# Patient Record
Sex: Male | Born: 1955 | Race: White | Hispanic: No | Marital: Married | Smoking: Former smoker
Health system: Southern US, Community
[De-identification: ages and names within clinical notes are randomized; demographics above are authoritative.]

## PROBLEM LIST (undated history)

## (undated) DIAGNOSIS — M1990 Unspecified osteoarthritis, unspecified site: Secondary | ICD-10-CM

## (undated) DIAGNOSIS — F419 Anxiety disorder, unspecified: Secondary | ICD-10-CM

## (undated) DIAGNOSIS — F329 Major depressive disorder, single episode, unspecified: Secondary | ICD-10-CM

## (undated) DIAGNOSIS — T884XXA Failed or difficult intubation, initial encounter: Secondary | ICD-10-CM

## (undated) DIAGNOSIS — I10 Essential (primary) hypertension: Secondary | ICD-10-CM

## (undated) DIAGNOSIS — J449 Chronic obstructive pulmonary disease, unspecified: Secondary | ICD-10-CM

## (undated) DIAGNOSIS — G8929 Other chronic pain: Secondary | ICD-10-CM

## (undated) DIAGNOSIS — I7300 Raynaud's syndrome without gangrene: Secondary | ICD-10-CM

## (undated) DIAGNOSIS — F32A Depression, unspecified: Secondary | ICD-10-CM

## (undated) HISTORY — PX: APPENDECTOMY: SHX54

## (undated) HISTORY — DX: Chronic obstructive pulmonary disease, unspecified: J44.9

---

## 2006-06-19 ENCOUNTER — Emergency Department: Payer: Self-pay | Admitting: Emergency Medicine

## 2008-12-15 ENCOUNTER — Emergency Department: Payer: Self-pay | Admitting: Emergency Medicine

## 2008-12-16 ENCOUNTER — Emergency Department: Payer: Self-pay | Admitting: Unknown Physician Specialty

## 2008-12-17 ENCOUNTER — Emergency Department: Payer: Self-pay | Admitting: Internal Medicine

## 2010-01-07 ENCOUNTER — Emergency Department: Payer: Self-pay | Admitting: Emergency Medicine

## 2010-02-15 ENCOUNTER — Emergency Department: Payer: Self-pay | Admitting: Emergency Medicine

## 2010-07-30 ENCOUNTER — Emergency Department: Payer: Self-pay | Admitting: Emergency Medicine

## 2010-08-04 ENCOUNTER — Emergency Department: Payer: Self-pay | Admitting: Emergency Medicine

## 2010-08-27 ENCOUNTER — Ambulatory Visit: Payer: Self-pay | Admitting: Neurosurgery

## 2012-03-16 ENCOUNTER — Emergency Department: Payer: Self-pay | Admitting: Emergency Medicine
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
## 2013-12-08 ENCOUNTER — Emergency Department: Payer: Self-pay | Admitting: Emergency Medicine

## 2014-11-20 DIAGNOSIS — G8929 Other chronic pain: Secondary | ICD-10-CM | POA: Insufficient documentation

## 2014-11-20 DIAGNOSIS — Z720 Tobacco use: Secondary | ICD-10-CM | POA: Insufficient documentation

## 2014-11-20 DIAGNOSIS — M25562 Pain in left knee: Secondary | ICD-10-CM | POA: Insufficient documentation

## 2014-11-20 DIAGNOSIS — M545 Low back pain, unspecified: Secondary | ICD-10-CM | POA: Insufficient documentation

## 2014-12-31 ENCOUNTER — Encounter: Payer: Self-pay | Admitting: Emergency Medicine

## 2014-12-31 ENCOUNTER — Emergency Department
Admission: EM | Admit: 2014-12-31 | Discharge: 2014-12-31 | Disposition: A | Discharge status: Home / Self Care | Payer: Self-pay | Attending: Emergency Medicine | Admitting: Emergency Medicine

## 2014-12-31 DIAGNOSIS — K047 Periapical abscess without sinus: Secondary | ICD-10-CM | POA: Insufficient documentation

## 2014-12-31 DIAGNOSIS — I10 Essential (primary) hypertension: Secondary | ICD-10-CM | POA: Insufficient documentation

## 2014-12-31 DIAGNOSIS — Z720 Tobacco use: Secondary | ICD-10-CM | POA: Insufficient documentation

## 2014-12-31 HISTORY — DX: Essential (primary) hypertension: I10

## 2014-12-31 MED ORDER — IBUPROFEN 800 MG PO TABS: 800.0000 mg | ORAL_TABLET | Freq: Three times a day (TID) | ORAL | Status: DC | PRN

## 2014-12-31 MED ORDER — PENICILLIN V POTASSIUM 500 MG PO TABS: 500.0000 mg | ORAL_TABLET | Freq: Four times a day (QID) | ORAL | Status: DC

## 2014-12-31 MED ORDER — CLINDAMYCIN PHOSPHATE 900 MG/6ML IJ SOLN
600.0000 mg | Freq: Once | INTRAMUSCULAR | Status: AC
Start: 2014-12-31 — End: 2014-12-31
  Administered 2014-12-31: 600 mg via INTRAMUSCULAR
  Filled 2014-12-31: qty 6

## 2014-12-31 NOTE — ED Notes (Signed)
Swelling to left tooth.

## 2014-12-31 NOTE — Discharge Instructions (Signed)
Dental Abscess °A dental abscess is pus in or around a tooth. °HOME CARE °· Take medicines only as told by your dentist. °· If you were prescribed antibiotic medicine, finish all of it even if you start to feel better. °· Rinse your mouth (gargle) often with salt water. °· Do not drive or use heavy machinery, like a lawn mower, while taking pain medicine. °· Do not apply heat to the outside of your mouth. °· Keep all follow-up visits as told by your dentist. This is important. °GET HELP IF: °· Your pain is worse, and medicine does not help. °GET HELP RIGHT AWAY IF: °· You have a fever or chills. °· Your symptoms suddenly get worse. °· You have a very bad headache. °· You have problems breathing or swallowing. °· You have trouble opening your mouth. °· You have puffiness (swelling) in your neck or around your eye. °  °This information is not intended to replace advice given to you by your health care provider. Make sure you discuss any questions you have with your health care provider. °  °Document Released: 07/22/2014 Document Reviewed: 07/22/2014 °Elsevier Interactive Patient Education ©2016 Elsevier Inc[employer] ° ° °OPTIONS FOR DENTAL FOLLOW UP CARE ° °[place] Department of Health and Human Services - Local Safety Net Dental Clinics °http://www.ncdhhs.gov/dph/oralhealth/services/safetynetclinics.htm °  °[facility] (336-562-3123) ° °[facility] [place] (919-933-9087) ° °[facility] (919-663-1744 ext 237) ° °[facility] (336-570-6415) ° °[facility] (919-968-2025) °This clinic caters to the indigent population and is on a lottery system. °Location: °[facility], [address], [place] °Clinic Hours: °Wednesdays from 6pm - 9pm, patients seen by a lottery system. °For dates, call or go to www.med.[facility].edu/[facility]/patients/Dental-[facility] °Services: °Cleanings, fillings and simple extractions. °Payment Options: °DENTAL WORK IS FREE OF CHARGE. Bring proof  of income or support. °Best way to get seen: °Arrive at 5:15 pm - this is a lottery, NOT first come/first serve, so arriving earlier will not increase your chances of being seen. °  °  °[facility] °919-537-3737 °Select option 1 for emergencies °  °Location: °[facility], [address], [place] °Clinic Hours: °No walk-ins accepted - call the day before to schedule an appointment. °Check in times are 9:30 am and 1:30 pm. °Services: °Simple extractions, temporary fillings, pulpectomy/pulp debridement, uncomplicated abscess drainage. °Payment Options: °PAYMENT IS DUE AT THE TIME OF SERVICE.  Fee is usually $100-200, additional surgical procedures (e.g. abscess drainage) may be extra. °Cash, checks, Visa/MasterCard accepted.  Can file Medicaid if patient is covered for dental - patient should call case worker to check. °No discount for [facility] patients. °Best way to get seen: °MUST call the day before and get onto the schedule. Can usually be seen the next 1-2 days. No walk-ins accepted. °  °  °[facility] °919-933-9087 °  °Location: °[facility], [address], [place] °Clinic Hours: °M, W, Th, F 8am or 1:30pm, Tues 9a or 1:30 - first come/first served. °Services: °Simple extractions, temporary fillings, uncomplicated abscess drainage.  You do not need to be an [place] resident. °Payment Options: °PAYMENT IS DUE AT THE TIME OF SERVICE. °Dental insurance, otherwise sliding scale - bring proof of income or support. °Depending on income and treatment needed, cost is usually $50-200. °Best way to get seen: °Arrive early as it is first come/first served. °  °  °[facility] °919-542-1641 °  °  Location: °[address] °Clinic Hours: °Mon-Thu 8a-5p °Services: °Most basic dental services including extractions and fillings. °Payment Options: °PAYMENT IS DUE AT THE TIME OF  SERVICE. °Sliding scale, up to 50% off - bring proof if income or support. °Medicaid with dental option accepted. °Best way to get seen: °Call to schedule an appointment, can usually be seen within 2 weeks OR they will try to see walk-ins - show up at 8a or 2p (you may have to wait). °  °  °[facility] °919-245-2435 °[place] RESIDENTS ONLY °  °Location: °[facility], [address], [place], [place] [address] °Clinic Hours: By appointment only. °Monday - Thursday 8am-5pm, Friday 8am-12pm °Services: Cleanings, fillings, extractions. °Payment Options: °PAYMENT IS DUE AT THE TIME OF SERVICE. °Cash, Visa or MasterCard. Sliding scale - $30 minimum per service. °Best way to get seen: °Come in to office, complete packet and make an appointment - need proof of income °or support monies for each household member and proof of [place] residence. °Usually takes about a month to get in. °  °  °[facility] °[phone] °  °Location: °[address] [place] °Clinic Hours: Walk-in Urgent Care Dental Services are offered Monday-Friday mornings only. °The numbers of emergencies accepted daily is limited to the number of °providers available. °Maximum 15 - Mondays, Wednesdays & Thursdays °Maximum 10 - Tuesdays & Fridays °Services: °You do not need to be a [place] resident to be seen for a dental emergency. °Emergencies are defined as pain, swelling, abnormal bleeding, or dental trauma. Walkins will receive x-rays if needed. °NOTE: Dental cleaning is not an emergency. °Payment Options: °PAYMENT IS DUE AT THE TIME OF SERVICE. °Minimum co-pay is $40.00 for uninsured patients. °Minimum co-pay is $3.00 for Medicaid with dental coverage. °Dental Insurance is accepted and must be presented at time of visit. °Medicare does not cover dental. °Forms of payment: Cash, credit card, checks. °Best way to get seen: °If not previously registered with the clinic,  walk-in dental registration begins at 7:15 am and is on a first come/first serve basis. °If previously registered with the clinic, call to make an appointment. °  °  °[facility] °919-776-4359 °[place] RESIDENTS ONLY °  °Location: °[address], [place],  °Clinic Hours: °Mon-Thu 10a-2p °Services: Extractions only! °Payment Options: °FREE (donations accepted) - bring proof of income or support °Best way to get seen: °Call and schedule an appointment OR come at 8am on the 1st Monday of every month (except for holidays) when it is first come/first served. °  °  °[facility] °[phone] °  °Location: °[address], [place] °Clinic Hours: °Friday mornings °Services, Payment Options, Best way to get seen: °Call for info °

## 2014-12-31 NOTE — ED Provider Notes (Signed)
[facility] Emergency Department Provider Note  ____________________________________________  Time seen: Approximately 9:08 AM  I have reviewed the triage vital signs and the nursing notes.   HISTORY  Chief Complaint Dental Pain   HPI Carlisle Enke is a 59 y.o. male is here with complaint of left lower tooth pain and abscess. Patient states that he "lost the filling in his tooth". He states he has called several dentists to not see him while he has infection in his tooth. Patient has applied some over-the-counter paste to his tooth. He states that last evening his face began to swell. He denies any known fever or chills. He has not taken any over-the-counter medication for this. Patient does continue to smoke 1 pack cigarettes per day.  Past Medical History  Diagnosis Date  . Hypertension     There are no active problems to display for this patient.   No past surgical history on file.  Current Outpatient Rx  Name  Route  Sig  Dispense  Refill  . ibuprofen (ADVIL,MOTRIN) 800 MG tablet   Oral   Take 1 tablet (800 mg total) by mouth every 8 (eight) hours as needed.   30 tablet   0   . penicillin v potassium (VEETID) 500 MG tablet   Oral   Take 1 tablet (500 mg total) by mouth 4 (four) times daily.   28 tablet   0     Allergies Review of patient's allergies indicates no known allergies.  No family history on file.  Social History Social History  Substance Use Topics  . Smoking status: Current Every Day Smoker  . Smokeless tobacco: None  . Alcohol Use: Yes    Review of Systems Constitutional: No fever/chills ENT: No sore throat. Dental pain positive Cardiovascular: Denies chest pain. Respiratory: Denies shortness of breath. Gastrointestinal:  No nausea, no vomiting. Musculoskeletal: Negative for back pain. Skin: Negative for rash. Neurological: Negative for headaches, focal weakness or numbness.   10-point ROS otherwise  negative.  ____________________________________________   PHYSICAL EXAM:  VITAL SIGNS: ED Triage Vitals  Enc Vitals Group     BP 12/31/14 0836 154/73 mmHg     Pulse Rate 12/31/14 0836 108     Resp 12/31/14 0836 18     Temp 12/31/14 0836 97.4 F (36.3 C)     Temp Source 12/31/14 0836 Oral     SpO2 12/31/14 0836 97 %     Weight 12/31/14 0836 140 lb (63.504 kg)     Height 12/31/14 0836  (1.702 m)     Head Cir --      Peak Flow --      Pain Score 12/31/14 0832 10     Pain Loc --      Pain Edu? --      Excl. in GC? --     Constitutional: Alert and oriented. Well appearing and in no acute distress. Eyes: Conjunctivae are normal. PERRL. EOMI. Head: Atraumatic. Nose: No congestion/rhinnorhea. Mouth/Throat: Mucous membranes are moist.  Oropharynx non-erythematous. Left lower molar posteriorly with white substance packed into the tooth. There is moderate swelling of the gum in that area. No active drainage is noted. Mouth generally is in poor dentition. There is moderate edema on the left lower mandible exteriorly that coincides with the dental abscess. Neck: No stridor. Supple  Hematological/Lymphatic/Immunilogical: No cervical lymphadenopathy. Cardiovascular: Normal rate, regular rhythm. Grossly normal heart sounds.  Good peripheral circulation. Respiratory: Normal respiratory effort.  No retractions. Lungs CTAB. Gastrointestinal:  Soft and nontender. No distention.  Musculoskeletal: No lower extremity tenderness nor edema.  No joint effusions. Neurologic:  Normal speech and language. No gross focal neurologic deficits are appreciated. No gait instability. Skin:  Skin is warm, dry and intact. No rash noted. Psychiatric: Mood and affect are normal. Speech and behavior are normal.  ____________________________________________   LABS (all labs ordered are listed, but only abnormal results are displayed)  Labs Reviewed - No data to display PROCEDURES  Procedure(s) performed:  None  Critical Care performed: No  ____________________________________________   INITIAL IMPRESSION / ASSESSMENT AND PLAN / ED COURSE  Pertinent labs & imaging results that were available during my care of the patient were reviewed by me and considered in my medical decision making (see chart for details).  She was given clindamycin while in the emergency room. He was also given a prescription for Pen-Vee K to be taken 4 times a day. He is given a prescription for ibuprofen 800 mg 3 times a day. He is to call for a dental appointment. ____________________________________________   FINAL CLINICAL IMPRESSION(S) / ED DIAGNOSES  Final diagnoses:  Dental abscess      Tommi RumpsRhonda L Summers, PA-C 12/31/14 1224  Jeanmarie PlantJames A McShane, MD 12/31/14 1529

## 2016-02-15 ENCOUNTER — Observation Stay
Admission: EM | Admit: 2016-02-15 | Discharge: 2016-02-15 | Discharge status: Against Medical Advice | Payer: Self-pay | Attending: Internal Medicine | Admitting: Internal Medicine

## 2016-02-15 ENCOUNTER — Encounter: Payer: Self-pay | Admitting: Emergency Medicine

## 2016-02-15 ENCOUNTER — Emergency Department: Payer: Self-pay

## 2016-02-15 DIAGNOSIS — F329 Major depressive disorder, single episode, unspecified: Secondary | ICD-10-CM | POA: Insufficient documentation

## 2016-02-15 DIAGNOSIS — R079 Chest pain, unspecified: Principal | ICD-10-CM

## 2016-02-15 DIAGNOSIS — Z79899 Other long term (current) drug therapy: Secondary | ICD-10-CM | POA: Insufficient documentation

## 2016-02-15 DIAGNOSIS — Z8249 Family history of ischemic heart disease and other diseases of the circulatory system: Secondary | ICD-10-CM | POA: Insufficient documentation

## 2016-02-15 DIAGNOSIS — F17200 Nicotine dependence, unspecified, uncomplicated: Secondary | ICD-10-CM | POA: Insufficient documentation

## 2016-02-15 DIAGNOSIS — R0602 Shortness of breath: Secondary | ICD-10-CM

## 2016-02-15 DIAGNOSIS — I7300 Raynaud's syndrome without gangrene: Secondary | ICD-10-CM | POA: Insufficient documentation

## 2016-02-15 DIAGNOSIS — Z8379 Family history of other diseases of the digestive system: Secondary | ICD-10-CM | POA: Insufficient documentation

## 2016-02-15 DIAGNOSIS — I10 Essential (primary) hypertension: Secondary | ICD-10-CM | POA: Insufficient documentation

## 2016-02-15 DIAGNOSIS — I700 Atherosclerosis of aorta: Secondary | ICD-10-CM | POA: Insufficient documentation

## 2016-02-15 DIAGNOSIS — R61 Generalized hyperhidrosis: Secondary | ICD-10-CM

## 2016-02-15 DIAGNOSIS — F419 Anxiety disorder, unspecified: Secondary | ICD-10-CM | POA: Insufficient documentation

## 2016-02-15 DIAGNOSIS — G8929 Other chronic pain: Secondary | ICD-10-CM | POA: Insufficient documentation

## 2016-02-15 HISTORY — DX: Unspecified osteoarthritis, unspecified site: M19.90

## 2016-02-15 HISTORY — DX: Depression, unspecified: F32.A

## 2016-02-15 HISTORY — DX: Major depressive disorder, single episode, unspecified: F32.9

## 2016-02-15 HISTORY — DX: Other chronic pain: G89.29

## 2016-02-15 HISTORY — DX: Anxiety disorder, unspecified: F41.9

## 2016-02-15 HISTORY — DX: Raynaud's syndrome without gangrene: I73.00

## 2016-02-15 LAB — BASIC METABOLIC PANEL
Anion gap: 7 (ref 5–15)
BUN: 16 mg/dL (ref 6–20)
CO2: 28 mmol/L (ref 22–32)
Calcium: 8.9 mg/dL (ref 8.9–10.3)
Chloride: 104 mmol/L (ref 101–111)
Creatinine, Ser: 0.64 mg/dL (ref 0.61–1.24)
GFR calc Af Amer: >60 mL/min (ref >60)
GFR calc non Af Amer: >60 mL/min (ref >60)
Glucose, Bld: 103 mg/dL — ABNORMAL HIGH (ref 65–99)
Potassium: 3.4 mmol/L — ABNORMAL LOW (ref 3.5–5.1)
Sodium: 139 mmol/L (ref 135–145)

## 2016-02-15 LAB — CBC
HCT: 43.6 % (ref 40.0–52.0)
Hemoglobin: 14.6 g/dL (ref 13.0–18.0)
MCH: 29.2 pg (ref 26.0–34.0)
MCHC: 33.6 g/dL (ref 32.0–36.0)
MCV: 87.1 fL (ref 80.0–100.0)
Platelets: 248 10*3/uL (ref 150–440)
RBC: 5.01 MIL/uL (ref 4.40–5.90)
RDW: 13.8 % (ref 11.5–14.5)
WBC: 12.7 10*3/uL — ABNORMAL HIGH (ref 3.8–10.6)

## 2016-02-15 LAB — TROPONIN I: Troponin I: <0.03 ng/mL (ref <0.03)

## 2016-02-15 MED ORDER — METOPROLOL TARTRATE 25 MG PO TABS: 25.0000 mg | ORAL_TABLET | Freq: Two times a day (BID) | ORAL | Status: DC

## 2016-02-15 MED ORDER — OXYCODONE HCL 5 MG PO TABS: 10.0000 mg | ORAL_TABLET | ORAL | Status: DC | PRN

## 2016-02-15 MED ORDER — NICOTINE 21 MG/24HR TD PT24
21.0000 mg | MEDICATED_PATCH | Freq: Every day | TRANSDERMAL | Status: DC
  Administered 2016-02-15: 21 mg via TRANSDERMAL
  Filled 2016-02-15: qty 1

## 2016-02-15 MED ORDER — OXYCODONE HCL ER 10 MG PO T12A: 10.0000 mg | EXTENDED_RELEASE_TABLET | ORAL | Status: DC | PRN

## 2016-02-15 MED ORDER — ACETAMINOPHEN 325 MG PO TABS: 650.0000 mg | ORAL_TABLET | Freq: Four times a day (QID) | ORAL | Status: DC | PRN

## 2016-02-15 MED ORDER — SODIUM CHLORIDE 0.9 % IV BOLUS (SEPSIS)
1000.0000 mL | Freq: Once | INTRAVENOUS | Status: AC
  Administered 2016-02-15: 1000 mL via INTRAVENOUS

## 2016-02-15 MED ORDER — ASPIRIN EC 81 MG PO TBEC: 81.0000 mg | DELAYED_RELEASE_TABLET | Freq: Every day | ORAL | Status: DC

## 2016-02-15 MED ORDER — ACETAMINOPHEN 650 MG RE SUPP: 650.0000 mg | Freq: Four times a day (QID) | RECTAL | Status: DC | PRN

## 2016-02-15 MED ORDER — FAMOTIDINE 20 MG PO TABS
20.0000 mg | ORAL_TABLET | Freq: Two times a day (BID) | ORAL | Status: DC
  Administered 2016-02-15: 20 mg via ORAL
  Filled 2016-02-15: qty 1

## 2016-02-15 MED ORDER — ENOXAPARIN SODIUM 40 MG/0.4ML ~~LOC~~ SOLN: 40.0000 mg | SUBCUTANEOUS | Status: DC

## 2016-02-15 MED ORDER — POTASSIUM CHLORIDE CRYS ER 20 MEQ PO TBCR: 40.0000 meq | EXTENDED_RELEASE_TABLET | Freq: Once | ORAL | Status: DC

## 2016-02-15 NOTE — ED Notes (Signed)
Pt did take a viagra last night

## 2016-02-15 NOTE — H&P (Addendum)
[facility]   PATIENT NAME: Leonard Rogers    MR#:  [id number]  DATE OF BIRTH:  1956/01/15  DATE OF ADMISSION:  02/15/2016  PRIMARY CARE PHYSICIAN: Dr. Excell [place]eltzerMosley in [place]  REQUESTING/REFERRING PHYSICIAN: Dr. Virgilio Freesaroline Norman  CHIEF COMPLAINT:   Chief Complaint  Patient presents with  . Chest Pain    HISTORY OF PRESENT ILLNESS:  Leonard Rogers  is a 60 y.o. male with a known history of Hypertension presents with chest pain. He took a Viagra at midnight. He ate a late supper and pump and pride. He developed worse chest pressure in his life around 6:30 this a.m. 10 out of 10 intensity. He never felt that before. Hard to describe. Walking would make it go away. Associated with sweating, clamminess, nauseousness and shortness of breath. EMS gave 4 aspirin and he started feeling a little bit better. Now he does not have any chest pain. In the ER his first troponin is negative. Hospitalist services contacted for further evaluation. Patient states his blood pressure was low with EMS.  PAST MEDICAL HISTORY:   Past Medical History:  Diagnosis Date  . Anxiety   . Arthritis   . Chronic pain   . Depression   . Hypertension   . Raynaud disease     PAST SURGICAL HISTORY:   Past Surgical History:  Procedure Laterality Date  . APPENDECTOMY      SOCIAL HISTORY:   Social History  Substance Use Topics  . Smoking status: Current Every Day Smoker    Packs/day: 1.00  . Smokeless tobacco: Never Used  . Alcohol use 4.2 oz/week    7 Cans of beer per week    FAMILY HISTORY:   Family History  Problem Relation Age of Onset  . CAD Mother   . Cirrhosis Father   . CAD Brother     DRUG ALLERGIES:   Allergies  Allergen Reactions  . Vistaril [Hydroxyzine Hcl]     Unknown per pt    REVIEW OF SYSTEMS:  CONSTITUTIONAL: No fever, Positive for fatigue. Positive for clamminess and sweatiness. EYES: Needs glasses EARS, NOSE, AND  THROAT: No tinnitus or ear pain. No sore throat. Positive runny nose RESPIRATORY: No cough, positive for shortness of breath, no wheezing or hemoptysis.  CARDIOVASCULAR: Positive for chest pain. No orthopnea, edema.  GASTROINTESTINAL: Positive for nausea. No vomiting, diarrhea or abdominal pain. No blood in bowel movements GENITOURINARY: No dysuria, hematuria.  ENDOCRINE: No polyuria, nocturia,  HEMATOLOGY: No anemia, easy bruising or bleeding SKIN: No rash or lesion. MUSCULOSKELETAL: No joint pain or arthritis.   NEUROLOGIC: No tingling, numbness, weakness.  PSYCHIATRY: Positive for anxiety and depression.   MEDICATIONS AT HOME:   Prior to Admission medications   Medication Sig Start Date End Date Taking? Authorizing Provider  amLODipine (NORVASC) 10 MG tablet Take 10 mg by mouth daily.   Yes Historical Provider, MD  celecoxib (CELEBREX) 200 MG capsule Take 200 mg by mouth 2 (two) times daily.   Yes Historical Provider, MD  hydrochlorothiazide (HYDRODIURIL) 25 MG tablet Take 25 mg by mouth daily.   Yes Historical Provider, MD  metoprolol tartrate (LOPRESSOR) 25 MG tablet Take 25 mg by mouth 2 (two) times daily.   Yes Historical Provider, MD  oxyCODONE (OXYCONTIN) 10 mg 12 hr tablet Take 10 mg by mouth every 4 (four) hours as needed.   Yes Historical Provider, MD      VITAL SIGNS:  Blood pressure 111/65, pulse 95, temperature 97.5  F (36.4 C), temperature source Oral, resp. rate 10, height 5\' 8"  (1.727 m), weight 59.9 kg (132 lb), SpO2 100 %.  PHYSICAL EXAMINATION:  GENERAL:  60 y.o.-year-old patient lying in the bed with no acute distress.  EYES: Pupils equal, round, reactive to light and accommodation. No scleral icterus. Extraocular muscles intact.  HEENT: Head atraumatic, normocephalic. Oropharynx and nasopharynx clear.  NECK:  Supple, no jugular venous distention. No thyroid enlargement, no tenderness.  LUNGS: Normal breath sounds bilaterally, no wheezing, rales,rhonchi or  crepitation. No use of accessory muscles of respiration.  CARDIOVASCULAR: S1, S2 normal. No murmurs, rubs, or gallops.  ABDOMEN: Soft, nontender, nondistended. Bowel sounds present. No organomegaly or mass.  EXTREMITIES: No pedal edema, cyanosis, or clubbing.  NEUROLOGIC: Cranial nerves II through XII are intact. Muscle strength 5/5 in all extremities. Sensation intact. Gait not checked.  PSYCHIATRIC: The patient is alert and oriented x 3.  SKIN: No rash, lesion, or ulcer.   LABORATORY PANEL:   CBC  Recent Labs Lab 02/15/16 0753  WBC 12.7*  HGB 14.6  HCT 43.6  PLT 248   ------------------------------------------------------------------------------------------------------------------  Chemistries   Recent Labs Lab 02/15/16 0753  NA 139  K 3.4*  CL 104  CO2 28  GLUCOSE 103*  BUN 16  CREATININE 0.64  CALCIUM 8.9   ------------------------------------------------------------------------------------------------------------------  Cardiac Enzymes  Recent Labs Lab 02/15/16 0753  TROPONINI <0.03   ------------------------------------------------------------------------------------------------------------------  RADIOLOGY:  Dg Chest 2 View  Result Date: 02/15/2016 CLINICAL DATA:  Chest pain.  Tobacco use. EXAM: CHEST  2 VIEW COMPARISON:  None. FINDINGS: Lungs are clear. Heart size and pulmonary vascularity are normal. No adenopathy. There is atherosclerotic calcification in the aorta. No bone lesions. IMPRESSION: Aortic atherosclerosis.  No edema or consolidation. Electronically Signed   By: Bretta BangWilliam  Woodruff III M.D.   On: 02/15/2016 08:33    EKG:   Normal sinus rhythm 91 bpm no acute ST-T wave changes.  IMPRESSION AND PLAN:   1. Chest pain. Admit as observation overnight get 2 more sets of cardiac enzymes. If those heart enzymes are negative will get a stress test in the morning. Continue aspirin. Since he ate late last night this could also be acid reflux. Start  Pepcid. Since he took a Viagra no nitrates can be given. 2. Chronic pain continue oxycodone 3. Essential hypertension with his blood pressure on the lower side. Continue metoprolol but hold her thiazide and Norvasc. 4. History of Raynaud's phenomenon 5. Tobacco abuse. Smoking cessation counseling done 3 minutes by me. Nicotine patch ordered  All the records are reviewed and case discussed with ED provider. Management plans discussed with the patient, family and they are in agreement.  CODE STATUS: Full code  TOTAL TIME TAKING CARE OF THIS PATIENT: 50 minutes.    Alford HighlandWIETING, Tyrese Ficek M.D on 02/15/2016 at 9:31 AM  Between 7am to 6pm - Pager - [phone]  After 6pm call admission pager [phone]  [facility]  [phone]  CC: Primary care physician; Dr. Excell SeltzerMosley in [place]

## 2016-02-15 NOTE — Progress Notes (Signed)
Pt reports no chest pain. A & O. Takes meds ok. Up to BR and tolerated it well. Up to BR and tolerated it. Pt walking towards elevator Jaimie spotted patient. Pt states "I'm leaving". Pt pulled IV out and tele removed. Pt signed AMA form and was witnessed by Electrical engineerrachel RN. MD wieting was notifiied.

## 2016-02-15 NOTE — ED Notes (Signed)
Patient transported to X-ray 

## 2016-02-15 NOTE — ED Provider Notes (Signed)
[facility] Emergency Department Provider Note  ____________________________________________  Time seen: Approximately 8:34 AM  I have reviewed the triage vital signs and the nursing notes.   HISTORY  Chief Complaint Chest Pain    HPI Leonard Rogers is a 60 y.o. 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 The patient reports that over the past week he has had 3 episodes of intermittent mild chest pain that can occur with exertion or at rest, are centrally located, and had no associated symptoms. They have resolved on their own. Last night, the patient took Viagra, had sexual intercourse, and around 6:30 AM was eating a piece of pie when he developed severepressure and tightness" on both sides of the breastbone." This was associated with shortness of breath, diaphoresis. EMS reports that the patient was pale and clammy on arrival. The patient reports that the pain subsided after chewing 4 baby aspirin. At this time, the patient is completely symptom-free. He has never had a stress test or cardiac catheterization.  FH: brother died MI age 58  SH: + tobacco, denies cocaine   Past Medical History:  Diagnosis Date  . Hypertension     There are no active problems to display for this patient.   History reviewed. No pertinent surgical history.  Current Outpatient Rx  . Order #: [id number] Class: Print  . Order #: [id number] Class: Print    Allergies Vistaril [hydroxyzine hcl]  History reviewed. No pertinent family history.  Social History Social History  Substance Use Topics  . Smoking status: Current Every Day Smoker  . Smokeless tobacco: Not on file  . Alcohol use Yes    Review of Systems Constitutional: No fever/chills.No lightheadedness or syncope. Positive diaphoresis Eyes: No visual changes. ENT: No sore throat. No congestion or rhinorrhea. Cardiovascular: Positive chest pain.  Denies palpitations. Respiratory: Positive shortness of breath.  No cough. Gastrointestinal: No abdominal pain.  Positive nausea, no vomiting.  No diarrhea.  No constipation. Genitourinary: Negative for dysuria. Musculoskeletal: Negative for back pain. Skin: Negative for rash. Neurological: Negative for headaches. No focal numbness, tingling or weakness.   10-point ROS otherwise negative.  ____________________________________________   PHYSICAL EXAM:  VITAL SIGNS: ED Triage Vitals  Enc Vitals Group     BP 02/15/16 0758 106/76     Pulse Rate 02/15/16 0758 93     Resp 02/15/16 0758 (!) 24     Temp 02/15/16 0758 97.5 F (36.4 C)     Temp Source 02/15/16 0758 Oral     SpO2 02/15/16 0758 98 %     Weight 02/15/16 0755 132 lb (59.9 kg)     Height 02/15/16 0755 5\' 8"  (1.727 m)     Head Circumference --      Peak Flow --      Pain Score --      Pain Loc --      Pain Edu? --      Excl. in GC? --     Constitutional: Alert and oriented. Well appearing and in no acute distress. Answers questions appropriately. Eyes: Conjunctivae are normal.  EOMI. No scleral icterus. Head: Atraumatic. Nose: No congestion/rhinnorhea. Mouth/Throat: Mucous membranes are moist.  Neck: No stridor.  Supple.  No JVD. Cardiovascular: Normal rate, regular rhythm. No murmurs, rubs or gallops.  Respiratory: Normal respiratory effort.  No accessory muscle use or retractions. Lungs CTAB.  No wheezes, rales or ronchi. Gastrointestinal: Soft, nontender and nondistended.  No guarding or rebound.  No peritoneal signs. Musculoskeletal: No LE edema. No ttp in the calves or palpable cords.  Negative Homan's sign. Neurologic:  A&Ox3.  Speech is clear.  Face and smile are symmetric.  EOMI.  Moves all extremities well. Skin:  Skin is warm, dry and intact. No rash noted. Psychiatric: Mood and affect are normal. Speech and behavior are normal.  Normal judgement.  ____________________________________________   LABS (all  labs ordered are listed, but only abnormal results are displayed)  Labs Reviewed  BASIC METABOLIC PANEL - Abnormal; Notable for the following:       Result Value   Potassium 3.4 (*)    Glucose, Bld 103 (*)    All other components within normal limits  CBC - Abnormal; Notable for the following:    WBC 12.7 (*)    All other components within normal limits  TROPONIN I   ____________________________________________  EKG  ED ECG REPORT I, Rockne MenghiniNorman, Anne-Caroline, the attending physician, personally viewed and interpreted this ECG.   Date: 02/15/2016  EKG Time: 756  Rate: 91  Rhythm: normal sinus rhythm  Axis: normal  Intervals:none  ST&T Change: No ST elevation  ____________________________________________  RADIOLOGY  Dg Chest 2 View  Result Date: 02/15/2016 CLINICAL DATA:  Chest pain.  Tobacco use. EXAM: CHEST  2 VIEW COMPARISON:  None. FINDINGS: Lungs are clear. Heart size and pulmonary vascularity are normal. No adenopathy. There is atherosclerotic calcification in the aorta. No bone lesions. IMPRESSION: Aortic atherosclerosis.  No edema or consolidation. Electronically Signed   By: Bretta BangWilliam  Woodruff III M.D.   On: 02/15/2016 08:33    ____________________________________________   PROCEDURES  Procedure(s) performed: None  Procedures  Critical Care performed: No ____________________________________________   INITIAL IMPRESSION / ASSESSMENT AND PLAN / ED COURSE  Pertinent labs & imaging results that were available during my care of the patient were reviewed by me and considered in my medical decision making (see chart for details).  60 y.o. male with a history of HTN, ongoing tobacco abuse presenting with an episode of chest pain that was severe after 1 week of multiple more mild episodes, in the setting of having taking Viagra. Overall, the patient is mildly hypotensive with a blood pressure of 97/56 on my examination. He is no longer having any chest pain, and  therefore heparinization is not indicated at this time. Nitroglycerin is also contraindicated given his recent Viagra use and hypotension. I am concerned about ACS or MI in this patient. His EKG does not show ischemic changes, but his recent chest pain episodes with severe chest pain this morning as well as his multiple risk factors is concerning. Other etiologies including esophageal spasm or GERD in the setting of eating pie, is also possible, but I have recommended that the patient remain at the hospital for full cardiac evaluation.  ____________________________________________  FINAL CLINICAL IMPRESSION(S) / ED DIAGNOSES  Final diagnoses:  Chest pain, unspecified type  Shortness of breath  Diaphoresis    Clinical Course       NEW MEDICATIONS STARTED DURING THIS VISIT:  New Prescriptions   No medications on file      Rockne MenghiniAnne-Caroline Karah Caruthers, MD 02/15/16 [phone]

## 2016-02-15 NOTE — ED Triage Notes (Signed)
Has had some intermitten chest pain on and off for couple days but 20 min prior to calling EMS severe substernal chest pain started. Per EMS when fire arrived on scene pt was pale and clammy. Pt now warm and dry. Pt did have diaphoresis per his report. Pt was eating a piece of pie when pain started. Brother died of heart attack at 5656. Large cardiac family hx.

## 2016-02-16 ENCOUNTER — Other Ambulatory Visit: Payer: Self-pay

## 2017-03-17 ENCOUNTER — Emergency Department: Payer: Self-pay

## 2017-03-17 ENCOUNTER — Emergency Department: Payer: Self-pay | Admitting: Anesthesiology

## 2017-03-17 ENCOUNTER — Other Ambulatory Visit: Payer: Self-pay

## 2017-03-17 ENCOUNTER — Emergency Department
Admission: EM | Admit: 2017-03-17 | Discharge: 2017-03-17 | Disposition: A | Discharge status: Home / Self Care | Payer: Self-pay | Attending: Emergency Medicine | Admitting: Emergency Medicine

## 2017-03-17 ENCOUNTER — Encounter
Admission: EM | Disposition: A | Discharge status: Home / Self Care | Payer: Self-pay | Source: Home / Self Care | Attending: Emergency Medicine

## 2017-03-17 ENCOUNTER — Encounter: Payer: Self-pay | Admitting: Emergency Medicine

## 2017-03-17 DIAGNOSIS — G8929 Other chronic pain: Secondary | ICD-10-CM | POA: Insufficient documentation

## 2017-03-17 DIAGNOSIS — F329 Major depressive disorder, single episode, unspecified: Secondary | ICD-10-CM | POA: Insufficient documentation

## 2017-03-17 DIAGNOSIS — F17210 Nicotine dependence, cigarettes, uncomplicated: Secondary | ICD-10-CM | POA: Insufficient documentation

## 2017-03-17 DIAGNOSIS — M1990 Unspecified osteoarthritis, unspecified site: Secondary | ICD-10-CM | POA: Insufficient documentation

## 2017-03-17 DIAGNOSIS — K222 Esophageal obstruction: Secondary | ICD-10-CM

## 2017-03-17 DIAGNOSIS — Z888 Allergy status to other drugs, medicaments and biological substances status: Secondary | ICD-10-CM | POA: Insufficient documentation

## 2017-03-17 DIAGNOSIS — R079 Chest pain, unspecified: Secondary | ICD-10-CM

## 2017-03-17 DIAGNOSIS — F419 Anxiety disorder, unspecified: Secondary | ICD-10-CM | POA: Insufficient documentation

## 2017-03-17 DIAGNOSIS — I10 Essential (primary) hypertension: Secondary | ICD-10-CM | POA: Insufficient documentation

## 2017-03-17 DIAGNOSIS — Z79899 Other long term (current) drug therapy: Secondary | ICD-10-CM | POA: Insufficient documentation

## 2017-03-17 DIAGNOSIS — X58XXXA Exposure to other specified factors, initial encounter: Secondary | ICD-10-CM | POA: Insufficient documentation

## 2017-03-17 DIAGNOSIS — K210 Gastro-esophageal reflux disease with esophagitis: Secondary | ICD-10-CM | POA: Insufficient documentation

## 2017-03-17 DIAGNOSIS — I7300 Raynaud's syndrome without gangrene: Secondary | ICD-10-CM | POA: Insufficient documentation

## 2017-03-17 DIAGNOSIS — T18128A Food in esophagus causing other injury, initial encounter: Secondary | ICD-10-CM

## 2017-03-17 DIAGNOSIS — K2950 Unspecified chronic gastritis without bleeding: Secondary | ICD-10-CM | POA: Insufficient documentation

## 2017-03-17 DIAGNOSIS — I739 Peripheral vascular disease, unspecified: Secondary | ICD-10-CM | POA: Insufficient documentation

## 2017-03-17 HISTORY — DX: Failed or difficult intubation, initial encounter: T88.4XXA

## 2017-03-17 HISTORY — PX: ESOPHAGOGASTRODUODENOSCOPY (EGD) WITH PROPOFOL: SHX5813

## 2017-03-17 LAB — BASIC METABOLIC PANEL
Anion gap: 11 (ref 5–15)
BUN: 18 mg/dL (ref 6–20)
CALCIUM: 9.8 mg/dL (ref 8.9–10.3)
CO2: 28 mmol/L (ref 22–32)
CREATININE: 0.78 mg/dL (ref 0.61–1.24)
Chloride: 101 mmol/L (ref 101–111)
GFR calc Af Amer: >60 mL/min (ref >60)
Glucose, Bld: 127 mg/dL — ABNORMAL HIGH (ref 65–99)
Potassium: 2.9 mmol/L — ABNORMAL LOW (ref 3.5–5.1)
SODIUM: 140 mmol/L (ref 135–145)

## 2017-03-17 LAB — CBC
HCT: 46.9 % (ref 40.0–52.0)
Hemoglobin: 15.6 g/dL (ref 13.0–18.0)
MCH: 28.5 pg (ref 26.0–34.0)
MCHC: 33.4 g/dL (ref 32.0–36.0)
MCV: 85.4 fL (ref 80.0–100.0)
PLATELETS: 413 10*3/uL (ref 150–440)
RBC: 5.49 MIL/uL (ref 4.40–5.90)
RDW: 13.6 % (ref 11.5–14.5)
WBC: 11.1 10*3/uL — AB (ref 3.8–10.6)

## 2017-03-17 LAB — TROPONIN I: Troponin I: <0.03 ng/mL (ref <0.03)

## 2017-03-17 SURGERY — ESOPHAGOGASTRODUODENOSCOPY (EGD) WITH PROPOFOL
Anesthesia: Monitor Anesthesia Care

## 2017-03-17 SURGERY — ESOPHAGOGASTRODUODENOSCOPY (EGD) WITH PROPOFOL
Anesthesia: General | Site: Esophagus | Wound class: Clean Contaminated

## 2017-03-17 MED ORDER — MIDAZOLAM HCL 2 MG/2ML IJ SOLN
INTRAMUSCULAR | Status: DC | PRN
  Administered 2017-03-17: 2 mg via INTRAVENOUS

## 2017-03-17 MED ORDER — ONDANSETRON HCL 4 MG/2ML IJ SOLN
INTRAMUSCULAR | Status: AC
  Filled 2017-03-17: qty 2

## 2017-03-17 MED ORDER — ONDANSETRON 4 MG PO TBDP
4.0000 mg | ORAL_TABLET | Freq: Once | ORAL | Status: AC
  Administered 2017-03-17: 4 mg via ORAL

## 2017-03-17 MED ORDER — SUCCINYLCHOLINE CHLORIDE 20 MG/ML IJ SOLN
INTRAMUSCULAR | Status: AC
Start: 2017-03-17 — End: ?
  Filled 2017-03-17: qty 1

## 2017-03-17 MED ORDER — GI COCKTAIL ~~LOC~~
ORAL | Status: AC
  Filled 2017-03-17: qty 30

## 2017-03-17 MED ORDER — ONDANSETRON HCL 4 MG/2ML IJ SOLN
4.0000 mg | Freq: Once | INTRAMUSCULAR | Status: AC
  Administered 2017-03-17: 4 mg via INTRAVENOUS

## 2017-03-17 MED ORDER — ONDANSETRON 4 MG PO TBDP
ORAL_TABLET | ORAL | Status: AC
  Filled 2017-03-17: qty 1

## 2017-03-17 MED ORDER — ROCURONIUM BROMIDE 50 MG/5ML IV SOLN
INTRAVENOUS | Status: AC
  Filled 2017-03-17: qty 1

## 2017-03-17 MED ORDER — ONDANSETRON HCL 4 MG/2ML IJ SOLN
INTRAMUSCULAR | Status: DC | PRN
  Administered 2017-03-17: 4 mg via INTRAVENOUS

## 2017-03-17 MED ORDER — DEXAMETHASONE SODIUM PHOSPHATE 10 MG/ML IJ SOLN
INTRAMUSCULAR | Status: AC
  Filled 2017-03-17: qty 1

## 2017-03-17 MED ORDER — FENTANYL CITRATE (PF) 100 MCG/2ML IJ SOLN: 25.0000 ug | INTRAMUSCULAR | Status: DC | PRN

## 2017-03-17 MED ORDER — GI COCKTAIL ~~LOC~~
30.0000 mL | Freq: Once | ORAL | Status: AC
  Administered 2017-03-17: 30 mL via ORAL

## 2017-03-17 MED ORDER — MORPHINE SULFATE (PF) 4 MG/ML IV SOLN
INTRAVENOUS | Status: AC
  Administered 2017-03-17: 4 mg via INTRAVENOUS
  Filled 2017-03-17: qty 1

## 2017-03-17 MED ORDER — SUCCINYLCHOLINE CHLORIDE 20 MG/ML IJ SOLN
INTRAMUSCULAR | Status: DC | PRN
  Administered 2017-03-17: 100 mg via INTRAVENOUS

## 2017-03-17 MED ORDER — MIDAZOLAM HCL 2 MG/2ML IJ SOLN
INTRAMUSCULAR | Status: AC
  Filled 2017-03-17: qty 2

## 2017-03-17 MED ORDER — MORPHINE SULFATE (PF) 4 MG/ML IV SOLN
4.0000 mg | Freq: Once | INTRAVENOUS | Status: AC
  Administered 2017-03-17: 4 mg via INTRAVENOUS

## 2017-03-17 MED ORDER — LORAZEPAM 2 MG/ML IJ SOLN
INTRAMUSCULAR | Status: AC
  Administered 2017-03-17: 0.5 mg via INTRAVENOUS
  Filled 2017-03-17: qty 1

## 2017-03-17 MED ORDER — FENTANYL CITRATE (PF) 100 MCG/2ML IJ SOLN
INTRAMUSCULAR | Status: AC
  Filled 2017-03-17: qty 2

## 2017-03-17 MED ORDER — LIDOCAINE HCL (PF) 2 % IJ SOLN
INTRAMUSCULAR | Status: AC
  Filled 2017-03-17: qty 10

## 2017-03-17 MED ORDER — DEXAMETHASONE SODIUM PHOSPHATE 10 MG/ML IJ SOLN
INTRAMUSCULAR | Status: DC | PRN
  Administered 2017-03-17: 10 mg via INTRAVENOUS

## 2017-03-17 MED ORDER — IPRATROPIUM-ALBUTEROL 0.5-2.5 (3) MG/3ML IN SOLN: 3.0000 mL | Freq: Once | RESPIRATORY_TRACT | Status: DC | PRN

## 2017-03-17 MED ORDER — SODIUM CHLORIDE 0.9 % IV SOLN: INTRAVENOUS | Status: DC

## 2017-03-17 MED ORDER — OXYCODONE HCL 5 MG/5ML PO SOLN: 5.0000 mg | Freq: Once | ORAL | Status: DC | PRN

## 2017-03-17 MED ORDER — SUGAMMADEX SODIUM 200 MG/2ML IV SOLN
INTRAVENOUS | Status: DC | PRN
  Administered 2017-03-17: 130 mg via INTRAVENOUS

## 2017-03-17 MED ORDER — ROCURONIUM BROMIDE 100 MG/10ML IV SOLN
INTRAVENOUS | Status: DC | PRN
  Administered 2017-03-17: 10 mg via INTRAVENOUS
  Administered 2017-03-17: 30 mg via INTRAVENOUS

## 2017-03-17 MED ORDER — SUGAMMADEX SODIUM 200 MG/2ML IV SOLN
INTRAVENOUS | Status: AC
  Filled 2017-03-17: qty 2

## 2017-03-17 MED ORDER — OXYCODONE HCL 5 MG PO TABS: 5.0000 mg | ORAL_TABLET | Freq: Once | ORAL | Status: DC | PRN

## 2017-03-17 MED ORDER — PROPOFOL 10 MG/ML IV BOLUS
INTRAVENOUS | Status: AC
Start: 2017-03-17 — End: ?
  Filled 2017-03-17: qty 20

## 2017-03-17 MED ORDER — FENTANYL CITRATE (PF) 100 MCG/2ML IJ SOLN
INTRAMUSCULAR | Status: DC | PRN
  Administered 2017-03-17: 100 ug via INTRAVENOUS
  Administered 2017-03-17: 150 ug via INTRAVENOUS

## 2017-03-17 MED ORDER — LACTATED RINGERS IV SOLN
INTRAVENOUS | Status: DC | PRN
  Administered 2017-03-17: 22:00:00 via INTRAVENOUS

## 2017-03-17 MED ORDER — LORAZEPAM 2 MG/ML IJ SOLN
0.5000 mg | Freq: Once | INTRAMUSCULAR | Status: AC
  Administered 2017-03-17: 0.5 mg via INTRAVENOUS

## 2017-03-17 NOTE — Discharge Instructions (Signed)

## 2017-03-17 NOTE — ED Provider Notes (Signed)
[facility] Emergency Department Provider Note       Time seen: ----------------------------------------- 7:58 PM on 03/17/2017 -----------------------------------------   I have reviewed the triage vital signs and the nursing notes.  HISTORY   Chief Complaint food bolus and Chest Pain    HPI Leonard Rogers is a 61 y.o. male with a history of anxiety, chronic pain, depression and hypertension who presents to the ED for esophageal food impaction.  Patient claims of chest pain and food stuck in his throat.  Wife states he was eating steak this afternoon got a piece stuck in his throat.  He was unable to get food to pass.  Wife states chest pain started right after he choked.  Heimlich maneuver was performed and they were not able to get food out of his throat.  Patient thinks he cannot eat anything right now because of the solid food that is stuck in his esophagus.  Past Medical History:  Diagnosis Date  . Anxiety   . Arthritis   . Chronic pain   . Depression   . Hypertension   . Raynaud disease     Patient Active Problem List   Diagnosis Date Noted  . Chest pain 02/15/2016    Past Surgical History:  Procedure Laterality Date  . APPENDECTOMY      Allergies Vistaril [hydroxyzine hcl]  Social History Social History   Tobacco Use  . Smoking status: Current Every Day Smoker    Packs/day: 1.00  . Smokeless tobacco: Never Used  Substance Use Topics  . Alcohol use: Yes    Alcohol/week: 4.2 oz    Types: 7 Cans of beer per week  . Drug use: No    Review of Systems Constitutional: Negative for fever. ENT: Positive for inability to swallow Cardiovascular: Positive for chest pain Respiratory: Negative for shortness of breath. Gastrointestinal: Negative for abdominal pain, positive for vomiting Musculoskeletal: Negative for back pain. Skin: Negative for rash. Neurological: Negative for headaches, focal weakness or numbness.  All systems  negative/normal/unremarkable except as stated in the HPI  ____________________________________________   PHYSICAL EXAM:  VITAL SIGNS: ED Triage Vitals  Enc Vitals Group     BP 03/17/17 1848 115/79     Pulse Rate 03/17/17 1848 (!) 105     Resp 03/17/17 1848 16     Temp 03/17/17 1848 98 F (36.7 C)     Temp Source 03/17/17 1848 Oral     SpO2 03/17/17 1848 100 %     Weight --      Height --      Head Circumference --      Peak Flow --      Pain Score 03/17/17 1847 7     Pain Loc --      Pain Edu? --      Excl. in GC? --     Constitutional: Alert, anxious, mild distress Eyes: Conjunctivae are normal. Normal extraocular movements. ENT   Head: Normocephalic and atraumatic.   Nose: No congestion/rhinnorhea.   Mouth/Throat: Mucous membranes are moist.   Neck: No stridor. Cardiovascular: Normal rate, regular rhythm. No murmurs, rubs, or gallops. Respiratory: Normal respiratory effort without tachypnea nor retractions. Breath sounds are clear and equal bilaterally. No wheezes/rales/rhonchi. Gastrointestinal: Soft and nontender. Normal bowel sounds Musculoskeletal: Nontender with normal range of motion in extremities. No lower extremity tenderness nor edema. Neurologic:  No gross focal neurologic deficits are appreciated.  Skin:  Skin is warm, dry and intact. No rash noted. Psychiatric: Anxious mood  ____________________________________________  ED COURSE:  As part of my medical decision making, I reviewed the following data within the electronic MEDICAL RECORD NUMBER History obtained from family if available, nursing notes, old chart and ekg, as well as notes from prior ED visits. Patient presented for possible food impaction, we will assess with labs and imaging as indicated at this time.  I will give Zofran as well as a GI cocktail to see if this alleviates his symptoms.   Procedures ____________________________________________   LABS (pertinent  positives/negatives)  Labs Reviewed  BASIC METABOLIC PANEL - Abnormal; Notable for the following components:      Result Value   Potassium 2.9 (*)    Glucose, Bld 127 (*)    All other components within normal limits  CBC - Abnormal; Notable for the following components:   WBC 11.1 (*)    All other components within normal limits  TROPONIN I    RADIOLOGY  Chest x-ray IMPRESSION: No active cardiopulmonary disease.  ____________________________________________  DIFFERENTIAL DIAGNOSIS   Esophageal food impaction, GERD, achalasia, esophageal stricture, perforation  FINAL ASSESSMENT AND PLAN  Esophageal food impaction, GERD   Plan: Patient had presented for inability to swallow after eating steak last night. Patient's labs are grossly unremarkable. Patient's imaging was negative as well.  He was unable to drink anything here.  We attempted Zofran, GI cocktail and carbonated beverage without success.  He subsequently vomited everything up almost immediately after swallowing.  I will discussed with gastroenterology for endoscopy and food removal.   Emily FilbertWilliams, Jonathan E, MD   Note: This note was generated in part or whole with voice recognition software. Voice recognition is usually quite accurate but there are transcription errors that can and very often do occur. I apologize for any typographical errors that were not detected and corrected.     Emily FilbertWilliams, Jonathan E, MD 03/17/17 [name]2015

## 2017-03-17 NOTE — ED Triage Notes (Signed)
Pt to ED via POV c/o chest pain and food stuck in his throat. Pt wife states that he was eating steak this afternoon and got a piece stuck in his throat. Pt unable to get food to pass. Pt wife states that CP started right after he choked. Pt was performed Heimlich maneuver and was able to get the food out of his throat but pt is still unable to get anything to pass. Pt In NAD at this time.

## 2017-03-17 NOTE — ED Notes (Signed)
FIRST NURSE NOTE-feels like choking on steak from last night and CP. Staggering in. Pulled for check in and triage. No drooling noted.

## 2017-03-17 NOTE — Anesthesia Postprocedure Evaluation (Signed)
Anesthesia Post Note  Patient: Leonard FabianJoseph Rogers  Procedure(s) Performed: ESOPHAGOGASTRODUODENOSCOPY (EGD) WITH PROPOFOL, removal of foreign body (N/A Esophagus)  Patient location during evaluation: PACU Anesthesia Type: General Level of consciousness: awake and alert Pain management: pain level controlled Vital Signs Assessment: post-procedure vital signs reviewed and stable Respiratory status: spontaneous breathing, nonlabored ventilation, respiratory function stable and patient connected to nasal cannula oxygen Cardiovascular status: blood pressure returned to baseline and stable Postop Assessment: no apparent nausea or vomiting Anesthetic complications: no     Last Vitals:  Vitals:   03/17/17 2320 03/17/17 2324  BP: 120/84 125/78  Pulse: 98 89  Resp: 13   Temp:  (!) 36.2 C  SpO2: 100%     Last Pain:  Vitals:   03/17/17 2324  TempSrc: Temporal  PainSc: 0-No pain                 Cleda MccreedyJoseph K Latorria Zeoli

## 2017-03-17 NOTE — Anesthesia Post-op Follow-up Note (Signed)
Anesthesia QCDR form completed.        

## 2017-03-17 NOTE — Progress Notes (Signed)
Received in Phase 2 at 2311.

## 2017-03-17 NOTE — Anesthesia Preprocedure Evaluation (Signed)
Anesthesia Evaluation  Patient identified by MRN, date of birth, ID band Patient awake    Reviewed: Allergy & Precautions, H&P , NPO status , Patient's Chart, lab work & pertinent test results  History of Anesthesia Complications Negative for: history of anesthetic complications  Airway Mallampati: III  TM Distance: <3 FB Neck ROM: limited    Dental  (+) Poor Dentition, Chipped, Missing   Pulmonary neg shortness of breath, Current Smoker,           Cardiovascular Exercise Tolerance: Good hypertension, (-) angina+ Peripheral Vascular Disease  (-) Past MI      Neuro/Psych PSYCHIATRIC DISORDERS Anxiety Depression negative neurological ROS     GI/Hepatic negative GI ROS, Neg liver ROS,   Endo/Other  negative endocrine ROS  Renal/GU      Musculoskeletal  (+) Arthritis ,   Abdominal   Peds  Hematology negative hematology ROS (+)   Anesthesia Other Findings Active food bolus  Past Medical History: No date: Anxiety No date: Arthritis No date: Chronic pain No date: Depression No date: Hypertension No date: Raynaud disease  Past Surgical History: No date: APPENDECTOMY     Reproductive/Obstetrics negative OB ROS                             Anesthesia Physical Anesthesia Plan  ASA: IV and emergent  Anesthesia Plan: General ETT, Rapid Sequence and Cricoid Pressure   Post-op Pain Management:    Induction: Intravenous  PONV Risk Score and Plan: Ondansetron, Midazolam, Dexamethasone and Treatment may vary due to age or medical condition  Airway Management Planned: Oral ETT  Additional Equipment:   Intra-op Plan:   Post-operative Plan: Extubation in OR  Informed Consent: I have reviewed the patients History and Physical, chart, labs and discussed the procedure including the risks, benefits and alternatives for the proposed anesthesia with the patient or authorized representative  who has indicated his/her understanding and acceptance.   Dental Advisory Given  Plan Discussed with: Anesthesiologist, CRNA and Surgeon  Anesthesia Plan Comments: (Patient consented for risks of anesthesia including but not limited to:  - adverse reactions to medications - damage to teeth, lips or other oral mucosa - sore throat or hoarseness - Damage to heart, brain, lungs or loss of life  Patient voiced understanding.)        Anesthesia Quick Evaluation

## 2017-03-17 NOTE — Op Note (Signed)
[facility] Gastroenterology Patient Name: Leonard FabianJoseph Daza Procedure Date: 03/17/2017 9:17 PM MRN: [id number] Account #: [id number] Date of Birth: Aug 10, 1955 Admit Type: Inpatient Age: [age] Room: [facility] ENDO ROOM 4 Gender: Male Note Status: Finalized Procedure:            Upper GI endoscopy Indications:          Removal of foreign body in the esophagus, Dysphagia Providers:            Boykin Nearingeodoro K. Fleta Borgeson MD, MD Medicines:            General Anesthesia Complications:        No immediate complications. Procedure:            Pre-Anesthesia Assessment:                       - The risks and benefits of the procedure and the                        sedation options and risks were discussed with the                        patient. All questions were answered and informed                        consent was obtained.                       - The risks and benefits of the procedure and the                        sedation options and risks were discussed with the                        patient. All questions were answered and informed                        consent was obtained.                       - Patient identification and proposed procedure were                        verified prior to the procedure by the nurse. The                        procedure was verified in the procedure room.                       - ASA Grade Assessment: II - A patient with mild                        systemic disease.                       - After reviewing the risks and benefits, the patient                        was deemed in satisfactory condition to undergo the                        procedure.  After obtaining informed consent, the endoscope was                        passed under direct vision. Throughout the procedure,                        the patient's blood pressure, pulse, and oxygen                        saturations were monitored continuously. The Endoscope                         was introduced through the mouth, and advanced to the                        third part of duodenum. The upper GI endoscopy was                        somewhat difficult due to presence of foreign body. The                        patient tolerated the procedure well. Findings:      Food was found in the lower third of the esophagus. Removal was       accomplished with a Raptor grasping device.      Mucosal changes including ringed esophagus and longitudinal furrows were       found in the entire esophagus. Biopsies were obtained from the proximal       and distal esophagus with cold forceps for histology of suspected       eosinophilic esophagitis.      The exam was otherwise without abnormality.      Patchy moderate inflammation characterized by congestion (edema) and       erythema was found in the gastric body.      Localized moderately congested mucosa without active bleeding and with       no stigmata of bleeding was found in the second portion of the duodenum. Impression:           - Food was found in the esophagus. Removal was                        successful.                       - Esophageal mucosal changes suggestive of eosinophilic                        esophagitis. Biopsied.                       - The examination was otherwise normal.                       - Chronic gastritis.                       - Congested duodenal mucosa. Recommendation:       - Patient has a contact number available for                        emergencies. The signs and symptoms of potential  delayed complications were discussed with the patient.                        Return to normal activities tomorrow. Written discharge                        instructions were provided to the patient.                       - Resume previous diet.                       - Mechanical soft diet for 3 days.                       - Increase Prilosec to 20mg  twice daily                        - Await pathology results.                       - Return to GI office in 2 months.                       - The findings and recommendations were discussed with                        the patient and their spouse. Procedure Code(s):    --- Professional ---                       [phone], Esophagogastroduodenoscopy, flexible, transoral;                        with removal of foreign body(s)                       43239, Esophagogastroduodenoscopy, flexible, transoral;                        with biopsy, single or multiple Diagnosis Code(s):    --- Professional ---                       W09.811B, Food in esophagus causing other injury,                        initial encounter                       K20.9, Esophagitis, unspecified                       K29.50, Unspecified chronic gastritis without bleeding                       K31.89, Other diseases of stomach and duodenum                       T18.108A, Unspecified foreign body in esophagus causing                        other injury, initial encounter  R13.10, Dysphagia, unspecified CPT copyright 2016 American Medical Association. All rights reserved. The codes documented in this report are preliminary and upon coder review may  be revised to meet current compliance requirements. Stanton Kidneyeodoro K Willford Rabideau MD, MD 03/17/2017 10:49:39 PM This report has been signed electronically. Number of Addenda: 0 Note Initiated On: 03/17/2017 9:17 PM      [facility]

## 2017-03-17 NOTE — Transfer of Care (Signed)
Immediate Anesthesia Transfer of Care Note  Patient: Leonard FabianJoseph Rogers  Procedure(s) Performed: ESOPHAGOGASTRODUODENOSCOPY (EGD) WITH PROPOFOL, removal of foreign body (N/A Esophagus)  Patient Location: PACU  Anesthesia Type:General  Level of Consciousness: awake, alert  and oriented  Airway & Oxygen Therapy: Patient Spontanous Breathing and Patient connected to face mask oxygen  Post-op Assessment: Report given to RN and Post -op Vital signs reviewed and stable  Post vital signs: Reviewed and stable  Last Vitals:  Vitals:   03/17/17 1848 03/17/17 2006  BP: 115/79 (!) 129/100  Pulse: (!) 105 98  Resp: 16 18  Temp: 36.7 C   SpO2: 100% 100%    Last Pain:  Vitals:   03/17/17 2044  TempSrc:   PainSc: 8          Complications: No apparent anesthesia complications

## 2017-03-17 NOTE — ED Notes (Signed)
MD at bedside. Pt given coke to try. Pt unable to keep down.

## 2017-03-17 NOTE — Consult Note (Addendum)
GI Emergency Consult Note Leonard Rogers Leonard Rogers, M.D.  Reason for Consult: Acute prolonged dysphagia, esophageal foreign body   Attending Requesting Consult: [name], M.D. emergency room physician  Outpatient Primary Physician: Pain management physician Dr. Excell SeltzerMosley [place]  History of Present Illness: Leonard Rogers is a 61 y.o. male with a history of Raynaud's phenomenon, chronic GERD and is partially edentulous presenting for dysphagia after eating steak almost 24 hours ago. E evening meal at 11 PM last night consisting of prime rib that the patient took himself. With the first couple of bites he noticed significant hesitation and swallowing and with the third bite everything appeared to be stuck. The patient is locating the area of moderate chest discomfort in the upper sternal region. He was able to vomit some of the food up but still has blockage and is unable to hold down oral secretions and is spitting into a bag. He has a history of chronic GERD managed with over-the-counter Prilosec for several years. He has no health insurance and is only able to afford to pay cash to his pain management physician located in [place], [place]. Chest x-ray was performed and is reportedly normal. Labs are been done which are reportedly normal except for potassium of 2.9. Patient denies any previous episodes with significant dysphagia and is endoscopy nave. He takes ibuprofen quite frequently for chronic arthritic pain. He takes oxycodone 10 when necessary for pain.  Past Medical History:  Past Medical History:  Diagnosis Date  . Anxiety   . Arthritis   . Chronic pain   . Depression   . Hypertension   . Raynaud disease     Problem List: Patient Active Problem List   Diagnosis Date Noted  . Chest pain 02/15/2016    Past Surgical History: Past Surgical History:  Procedure Laterality Date  . APPENDECTOMY      Allergies: Allergies  Allergen  Reactions  . Ace Inhibitors Anaphylaxis and Other (See Comments)  . Vistaril [Hydroxyzine Hcl]     Unknown per pt    Home Medications: Medications Prior to Admission  Medication Sig Dispense Refill Last Dose  . amLODipine (NORVASC) 10 MG tablet Take 10 mg by mouth daily.   03/16/2017 at 0800  . hydrochlorothiazide (HYDRODIURIL) 25 MG tablet Take 25 mg by mouth daily.   03/16/2017 at 0800  . metoprolol tartrate (LOPRESSOR) 25 MG tablet Take 25 mg by mouth 2 (two) times daily.   03/16/2017 at 2000   Home medication reconciliation was completed with the patient.   Scheduled Inpatient Medications:   . ondansetron        Continuous Inpatient Infusions:    PRN Inpatient Medications:    Family History: family history includes CAD in his brother and mother; Cirrhosis in his father.   GI Family History: Negative.  Social History:   reports that he has been smoking.  He has been smoking about 1.00 pack per day. he has never used smokeless tobacco. He reports that he drinks about 4.2 oz of alcohol per week. He reports that he does not use drugs. The patient denies ETOH, tobacco, or drug use.    Review of Systems: Review of Systems - Negative except Chest pain and chronic arthralgias as well as chronic back pain.  Physical Examination: BP (!) 129/100 (BP Location: Right Arm)   Pulse 98   Temp 98 F (36.7 C) (Oral)   Resp 18   SpO2 100%  Physical Exam  Constitutional: He is oriented  to person, place, and time. He appears distressed.  HENT:  Head: Normocephalic and atraumatic.  Eyes: Pupils are equal, round, and reactive to light.  Cardiovascular: Normal rate and regular rhythm. Exam reveals gallop.  No murmur heard. Pulmonary/Chest: Effort normal.  No crepitus or chest wall tenderness.  Abdominal: Soft. He exhibits no distension and no mass. There is no tenderness. There is no rebound and no guarding.  Musculoskeletal: He exhibits no deformity.  Neurological: He is alert and  oriented to person, place, and time.  Skin: Skin is warm and dry. He is not diaphoretic.  Psychiatric: Affect normal.    Data: Lab Results  Component Value Date   WBC 11.1 (H) 03/17/2017   HGB 15.6 03/17/2017   HCT 46.9 03/17/2017   MCV 85.4 03/17/2017   PLT 413 03/17/2017   Recent Labs  Lab 03/17/17 1846  HGB 15.6   Lab Results  Component Value Date   NA 140 03/17/2017   K 2.9 (L) 03/17/2017   CL 101 03/17/2017   CO2 28 03/17/2017   BUN 18 03/17/2017   CREATININE 0.78 03/17/2017   No results found for: ALT, AST, GGT, ALKPHOS, BILITOT No results for input(s): APTT, INR, PTT in the last 168 hours. CBC Latest Ref Rng & Units 03/17/2017 02/15/2016  WBC 3.8 - 10.6 K/uL 11.1(H) 12.7(H)  Hemoglobin 13.0 - 18.0 g/dL 40.915.6 81.114.6  Hematocrit 91.440.0 - 52.0 % 46.9 43.6  Platelets 150 - 440 K/uL 413 248    STUDIES: Dg Chest 2 View  Result Date: 03/17/2017 CLINICAL DATA:  Swallowed piece of prime rib 1 day ago. Unable to keep water down. EXAM: CHEST  2 VIEW COMPARISON:  February 15, 2016 FINDINGS: The heart size and mediastinal contours are within normal limits. Both lungs are clear. The visualized skeletal structures are stable. Mild degenerative joint changes of spine are noted. IMPRESSION: No active cardiopulmonary disease. Electronically Signed   By: Sherian ReinWei-Chen  Lin M.D.   On: 03/17/2017 19:38   @IMAGES @  Assessment: 1. Acute, prolonged dysphagia-likely secondary to #2 below versus esophageal dysmotility. 2. Esophageal foreign body. 3. Chronic GERD, treated with over-the-counter proton pump inhibitor therapy. Stable. 4. Chest pain-also secondary to #2 above.  Recommendations: 1. Urgent EGD with foreign body removal and possible esophageal dilatation and/or esophageal biopsy. The patient understands the nature of the planned procedure, indications, risks, alternatives and potential complications including but not limited to bleeding, infection, perforation, damage to internal  organs and possible oversedation/side effects from anesthesia. The patient agrees and gives consent to proceed.  Please refer to procedure notes for findings, recommendations and patient disposition/instructions.  Thank you for the consult. Please call with questions or concerns.   Addendum: Patient c/o acute chest pain in the pre-op area and so a second STAT CXR was ordered. I reviewed the film and there appeared to no acute abnormality.  We will therefore proceed with EGD. Rosina Lowensteinoledo, Maimouna Rondeau, MD  03/17/2017 9:35 PM

## 2017-03-17 NOTE — ED Notes (Signed)
Pt given medications and vitals taken. Pt then vomited and began arguing and becoming ill. Will notify MD Mayford KnifeWilliams

## 2017-03-17 NOTE — Anesthesia Procedure Notes (Signed)
Procedure Name: Intubation Date/Time: 03/17/2017 10:25 PM Performed by: Nelda Marseille, CRNA Pre-anesthesia Checklist: Patient identified, Patient being monitored, Timeout performed, Emergency Drugs available and Suction available Patient Re-evaluated:Patient Re-evaluated prior to induction Oxygen Delivery Method: Circle system utilized Preoxygenation: Pre-oxygenation with 100% oxygen Induction Type: IV induction Ventilation: Mask ventilation without difficulty Laryngoscope Size: Mac, 3 and Glidescope Grade View: Grade III Tube type: Oral Tube size: 7.0 mm Number of attempts: 1 Airway Equipment and Method: Stylet and Video-laryngoscopy Placement Confirmation: ETT inserted through vocal cords under direct vision,  positive ETCO2 and breath sounds checked- equal and bilateral Secured at: 21 cm Tube secured with: Tape Dental Injury: Teeth and Oropharynx as per pre-operative assessment  Difficulty Due To: Difficulty was anticipated and Difficult Airway- due to anterior larynx

## 2017-03-20 ENCOUNTER — Encounter: Payer: Self-pay | Admitting: Internal Medicine

## 2017-03-23 LAB — SURGICAL PATHOLOGY

## 2017-08-02 ENCOUNTER — Ambulatory Visit: Payer: Self-pay | Admitting: Internal Medicine

## 2017-08-09 ENCOUNTER — Ambulatory Visit: Payer: Self-pay | Admitting: Internal Medicine

## 2017-09-04 ENCOUNTER — Emergency Department: Payer: Self-pay

## 2017-09-04 ENCOUNTER — Other Ambulatory Visit: Payer: Self-pay

## 2017-09-04 ENCOUNTER — Emergency Department
Admission: EM | Admit: 2017-09-04 | Discharge: 2017-09-04 | Disposition: A | Discharge status: Other Acute Inpatient Hospital | Payer: Self-pay | Attending: Emergency Medicine | Admitting: Emergency Medicine

## 2017-09-04 DIAGNOSIS — J869 Pyothorax without fistula: Secondary | ICD-10-CM | POA: Insufficient documentation

## 2017-09-04 DIAGNOSIS — F419 Anxiety disorder, unspecified: Secondary | ICD-10-CM | POA: Insufficient documentation

## 2017-09-04 DIAGNOSIS — F329 Major depressive disorder, single episode, unspecified: Secondary | ICD-10-CM | POA: Insufficient documentation

## 2017-09-04 DIAGNOSIS — Z79899 Other long term (current) drug therapy: Secondary | ICD-10-CM | POA: Insufficient documentation

## 2017-09-04 DIAGNOSIS — I10 Essential (primary) hypertension: Secondary | ICD-10-CM | POA: Insufficient documentation

## 2017-09-04 DIAGNOSIS — F17200 Nicotine dependence, unspecified, uncomplicated: Secondary | ICD-10-CM | POA: Insufficient documentation

## 2017-09-04 LAB — COMPREHENSIVE METABOLIC PANEL
ALBUMIN: 2.3 g/dL — AB (ref 3.5–5.0)
ALK PHOS: 173 U/L — AB (ref 38–126)
ALT: 24 U/L (ref 17–63)
ANION GAP: 9 (ref 5–15)
AST: 18 U/L (ref 15–41)
BUN: 13 mg/dL (ref 6–20)
CALCIUM: 8.8 mg/dL — AB (ref 8.9–10.3)
CHLORIDE: 100 mmol/L — AB (ref 101–111)
CO2: 28 mmol/L (ref 22–32)
Creatinine, Ser: 0.68 mg/dL (ref 0.61–1.24)
GFR calc non Af Amer: >60 mL/min (ref >60)
GLUCOSE: 106 mg/dL — AB (ref 65–99)
POTASSIUM: 5.4 mmol/L — AB (ref 3.5–5.1)
SODIUM: 137 mmol/L (ref 135–145)
Total Bilirubin: 0.5 mg/dL (ref 0.3–1.2)
Total Protein: 8.3 g/dL — ABNORMAL HIGH (ref 6.5–8.1)

## 2017-09-04 LAB — ACETAMINOPHEN LEVEL

## 2017-09-04 LAB — CBC
HCT: 32.6 % — ABNORMAL LOW (ref 40.0–52.0)
Hemoglobin: 10.5 g/dL — ABNORMAL LOW (ref 13.0–18.0)
MCH: 24.3 pg — ABNORMAL LOW (ref 26.0–34.0)
MCHC: 32.4 g/dL (ref 32.0–36.0)
MCV: 75 fL — AB (ref 80.0–100.0)
PLATELETS: 718 10*3/uL — AB (ref 150–440)
RBC: 4.34 MIL/uL — ABNORMAL LOW (ref 4.40–5.90)
RDW: 23.7 % — ABNORMAL HIGH (ref 11.5–14.5)
WBC: 10.7 10*3/uL — AB (ref 3.8–10.6)

## 2017-09-04 LAB — URINALYSIS, COMPLETE (UACMP) WITH MICROSCOPIC
BILIRUBIN URINE: NEGATIVE
Bacteria, UA: NONE SEEN
GLUCOSE, UA: NEGATIVE mg/dL
Hgb urine dipstick: NEGATIVE
KETONES UR: NEGATIVE mg/dL
LEUKOCYTES UA: NEGATIVE
NITRITE: NEGATIVE
PH: 8.5 — AB (ref 5.0–8.0)
RBC / HPF: NONE SEEN RBC/hpf (ref 0–5)
Specific Gravity, Urine: 1.02 (ref 1.005–1.030)
WBC, UA: NONE SEEN WBC/hpf (ref 0–5)

## 2017-09-04 LAB — URINE DRUG SCREEN, QUALITATIVE (ARMC ONLY)
AMPHETAMINES, UR SCREEN: POSITIVE — AB
Benzodiazepine, Ur Scrn: NOT DETECTED
COCAINE METABOLITE, UR ~~LOC~~: NOT DETECTED
Cannabinoid 50 Ng, Ur ~~LOC~~: NOT DETECTED
MDMA (ECSTASY) UR SCREEN: NOT DETECTED
Methadone Scn, Ur: NOT DETECTED
Opiate, Ur Screen: POSITIVE — AB
PHENCYCLIDINE (PCP) UR S: NOT DETECTED
Tricyclic, Ur Screen: NOT DETECTED

## 2017-09-04 LAB — SALICYLATE LEVEL

## 2017-09-04 LAB — ETHANOL: Alcohol, Ethyl (B): <10 mg/dL (ref <10)

## 2017-09-04 MED ORDER — ACETAMINOPHEN 500 MG PO TABS
1000.00 | ORAL_TABLET | ORAL | Status: DC
Start: 2017-09-03 — End: 2017-09-04

## 2017-09-04 MED ORDER — GENERIC EXTERNAL MEDICATION
3.00 | Status: DC
Start: 2017-09-03 — End: 2017-09-04

## 2017-09-04 MED ORDER — PANTOPRAZOLE SODIUM 40 MG PO TBEC
40.00 | DELAYED_RELEASE_TABLET | ORAL | Status: DC
Start: 2017-09-04 — End: 2017-09-04

## 2017-09-04 MED ORDER — NICOTINE 14 MG/24HR TD PT24
1.00 | MEDICATED_PATCH | TRANSDERMAL | Status: DC
Start: 2017-09-04 — End: 2017-09-04

## 2017-09-04 MED ORDER — GENERIC EXTERNAL MEDICATION
1.00 | Status: DC
Start: ? — End: 2017-09-04

## 2017-09-04 MED ORDER — SODIUM CHLORIDE 0.9 % IV SOLN
INTRAVENOUS | Status: DC
Start: ? — End: 2017-09-04

## 2017-09-04 MED ORDER — LIDOCAINE 5 % EX PTCH
1.00 | MEDICATED_PATCH | CUTANEOUS | Status: DC
Start: 2017-09-03 — End: 2017-09-04

## 2017-09-04 MED ORDER — THERA-M PO TABS
1.00 | ORAL_TABLET | ORAL | Status: DC
Start: 2017-09-04 — End: 2017-09-04

## 2017-09-04 MED ORDER — OXYCODONE HCL 5 MG PO TABS
20.00 | ORAL_TABLET | ORAL | Status: DC
Start: ? — End: 2017-09-04

## 2017-09-04 MED ORDER — FERROUS GLUCONATE 324 (38 FE) MG PO TABS
324.00 | ORAL_TABLET | ORAL | Status: DC
Start: 2017-09-04 — End: 2017-09-04

## 2017-09-04 MED ORDER — MORPHINE SULFATE 4 MG/ML IJ SOLN
4.00 | INTRAMUSCULAR | Status: DC
Start: ? — End: 2017-09-04

## 2017-09-04 MED ORDER — POLYETHYLENE GLYCOL 3350 17 G PO PACK
17.00 | PACK | ORAL | Status: DC
Start: 2017-09-03 — End: 2017-09-04

## 2017-09-04 MED ORDER — POLYETHYLENE GLYCOL 3350 17 G PO PACK
17.00 | PACK | ORAL | Status: DC
Start: ? — End: 2017-09-04

## 2017-09-04 MED ORDER — SODIUM CHLORIDE 3 % IN NEBU
4.00 | INHALATION_SOLUTION | RESPIRATORY_TRACT | Status: DC
Start: ? — End: 2017-09-04

## 2017-09-04 MED ORDER — GENERIC EXTERNAL MEDICATION
2.00 | Status: DC
Start: 2017-09-03 — End: 2017-09-04

## 2017-09-04 MED ORDER — ENOXAPARIN SODIUM 40 MG/0.4ML ~~LOC~~ SOLN
40.00 | SUBCUTANEOUS | Status: DC
Start: 2017-09-04 — End: 2017-09-04

## 2017-09-04 MED ORDER — GENERIC EXTERNAL MEDICATION
2.00 | Status: DC
Start: ? — End: 2017-09-04

## 2017-09-04 MED ORDER — NICOTINE POLACRILEX 2 MG MT LOZG
2.00 | LOZENGE | OROMUCOSAL | Status: DC
Start: ? — End: 2017-09-04

## 2017-09-04 MED ORDER — DICLOFENAC SODIUM 1 % TD GEL
2.00 | TRANSDERMAL | Status: DC
Start: ? — End: 2017-09-04

## 2017-09-04 MED ORDER — ONDANSETRON 4 MG PO TBDP
4.00 | ORAL_TABLET | ORAL | Status: DC
Start: ? — End: 2017-09-04

## 2017-09-04 NOTE — BH Assessment (Signed)
This Clinical research associatewriter received return call patients wife Leonard RiserSusan Rogers at [phone] and she stated she had patient IVC because [facility] and he has been diagnosed with a deadly chest infection and she [facility]. Wife reported [facility] and he was accusing people of things and disoriented.

## 2017-09-04 NOTE — ED Notes (Signed)
This tech and bpd assisted pt to dressing in hospital attire for psych evaluation due to IVC papers, this bagged up pt belongings which included: bluetooth device, phone keys, lighter, wallet, shirt, belt, pants, socks, and white sneakers, blue ball cap  Pt very teary in triage room, explained process to pt, pt provided blood and given urine cup and showed bathroom in quad.  On pt left side upper back pt had bandage of some sort from [facility] this tech told amy, rn and dr about pt previus visit

## 2017-09-04 NOTE — ED Notes (Signed)
Patient refused shower,

## 2017-09-04 NOTE — ED Notes (Signed)

## 2017-09-04 NOTE — ED Provider Notes (Signed)
[facility] Emergency Department Provider Note ___________________________________________   First MD Initiated Contact with Patient 09/04/17 [phone]     (approximate)  I have reviewed the triage vital signs and the nursing notes.   HISTORY  Chief Complaint Psychiatric Evaluation  HPI Leonard Rogers is a 62 y.o. male with a history of remote alcoholism who is presenting to the emergency department today under involuntary commitment by his wife.  Per the involuntary commitment the patient has been confused and left [facility].  Per the patient, he states that his wife IVC them because he was unwilling to give her more of his pain medicine.  He is denying any cough, fever, chills or sweats.  Denies any shortness of breath.  Denies any suicidal or homicidal ideation.  Upon review [facility] it looks like he pulled out his chest tube as well as his IVs and left AMA just last night.  Low suspicion for TB.  Induced sputum AFB smear was negative at the time of leaving the hospital.  Patient saying that his wife should not be trusted and that she has just IVC to him for reasons of secondary gain.  Past Medical History:  Diagnosis Date  . Anxiety   . Arthritis   . Chronic pain   . Depression   . Difficult intubation   . Hypertension   . Raynaud disease     Patient Active Problem List   Diagnosis Date Noted  . Chest pain 02/15/2016    Past Surgical History:  Procedure Laterality Date  . APPENDECTOMY    . ESOPHAGOGASTRODUODENOSCOPY (EGD) WITH PROPOFOL N/A 03/17/2017   Procedure: ESOPHAGOGASTRODUODENOSCOPY (EGD) WITH PROPOFOL, removal of foreign body;  Surgeon: Toledo, Boykin Nearingeodoro K, MD;  Location: [facility] ENDOSCOPY;  Service: Gastroenterology;  Laterality: N/A;    Prior to Admission medications   Medication Sig Start Date End Date Taking? Authorizing Provider  amLODipine (NORVASC) 10 MG tablet Take 10 mg by mouth daily.     [provider]  hydrochlorothiazide (HYDRODIURIL) 25 MG tablet Take 25 mg by mouth daily.    [provider]  metoprolol tartrate (LOPRESSOR) 25 MG tablet Take 25 mg by mouth 2 (two) times daily.    [provider]    Allergies Ace inhibitors and Vistaril [hydroxyzine hcl]  Family History  Problem Relation Age of Onset  . CAD Mother   . Cirrhosis Father   . CAD Brother     Social History Social History   Tobacco Use  . Smoking status: Current Every Day Smoker    Packs/day: 1.00  . Smokeless tobacco: Never Used  Substance Use Topics  . Alcohol use: Yes    Alcohol/week: 4.2 oz    Types: 7 Cans of beer per week  . Drug use: No    Review of Systems  Constitutional: No fever/chills Eyes: No visual changes. ENT: No sore throat. Cardiovascular: Denies chest pain. Respiratory: Denies shortness of breath. Gastrointestinal: No abdominal pain.  No nausea, no vomiting.  No diarrhea.  No constipation. Genitourinary: Negative for dysuria. Musculoskeletal: Negative for back pain. Skin: Negative for rash. Neurological: Negative for headaches, focal weakness or numbness.   ____________________________________________   PHYSICAL EXAM:  VITAL SIGNS: ED Triage Vitals  Enc Vitals Group     BP 09/04/17 0854 (!) 157/92     Pulse Rate 09/04/17 0854 (!) 129     Resp 09/04/17 0854 18     Temp 09/04/17 0854 99.6 F (37.6  C)     Temp Source 09/04/17 0854 Oral     SpO2 09/04/17 0854 97 %     Weight 09/04/17 0857 116 lb (52.6 kg)     Height 09/04/17 0857 5\' 7"  (1.702 m)     Head Circumference --      Peak Flow --      Pain Score 09/04/17 0857 0     Pain Loc --      Pain Edu? --      Excl. in GC? --     Constitutional: Alert and oriented.  Cachectic.  Patient does not appear clinically intoxicated. Eyes: Conjunctivae are normal.  Head: Atraumatic. Nose: No congestion/rhinnorhea. Mouth/Throat: Mucous membranes are moist.  Neck: No stridor.    Cardiovascular: Normal rate, regular rhythm. Grossly normal heart sounds.  Respiratory: Normal respiratory effort.  No retractions. Lungs CTAB.  Right-sided chest wall with Vaseline bandage in place.  Raised area where it appears the chest tube was inserted but is nontender, nonfluctuant and without any drainage.  No hits of air from around the site. Gastrointestinal: Soft and nontender. No distention.  Musculoskeletal: Bilateral moderate lower extremity edema which the patient says is chronic.  No joint effusions. Neurologic:  Normal speech and language. No gross focal neurologic deficits are appreciated. Skin:  Skin is warm, dry and intact. No rash noted. Psychiatric: Mood and affect are normal. Speech and behavior are normal.  ____________________________________________   LABS (all labs ordered are listed, but only abnormal results are displayed)  Labs Reviewed  COMPREHENSIVE METABOLIC PANEL - Abnormal; Notable for the following components:      Result Value   Potassium 5.4 (*)    Chloride 100 (*)    Glucose, Bld 106 (*)    Calcium 8.8 (*)    Total Protein 8.3 (*)    Albumin 2.3 (*)    Alkaline Phosphatase 173 (*)    All other components within normal limits  CBC - Abnormal; Notable for the following components:   WBC 10.7 (*)    RBC 4.34 (*)    Hemoglobin 10.5 (*)    HCT 32.6 (*)    MCV 75.0 (*)    MCH 24.3 (*)    RDW 23.7 (*)    Platelets 718 (*)    All other components within normal limits  ETHANOL  URINE DRUG SCREEN, QUALITATIVE ([facility] ONLY)  ACETAMINOPHEN LEVEL  SALICYLATE LEVEL  URINALYSIS, COMPLETE (UACMP) WITH MICROSCOPIC   ____________________________________________  EKG   ____________________________________________  RADIOLOGY  Small right hydropneumothorax in the right base region.  No tension component.  Likely effusion on the right.  Patchy infiltrate right lower lobe suspicious for  pneumonia. ____________________________________________   PROCEDURES  Procedure(s) performed:   Procedures  Critical Care performed:   ____________________________________________   INITIAL IMPRESSION / ASSESSMENT AND PLAN / ED COURSE  Pertinent labs & imaging results that were available during my care of the patient were reviewed by me and considered in my medical decision making (see chart for details).  DDX: Recurrent empyema, peripheral edema, failure to thrive, psychosis As part of my medical decision making, I reviewed the following data within the electronic MEDICAL RECORD NUMBER Notes from prior ED visits and recent [facility] admission.  Will uphold IVC.  Pending callback from [facility] medicine service.  ----------------------------------------- 10:33 AM on 09/04/2017 -----------------------------------------  I am concerned for the patient given that he was severely ill at [facility].  He appears improved but his condition is by no means resolved per the  chest x-ray we have obtained today.  I discussed case with Dr. Clydene Pugh who is part of the internal medicine team that was taking care of the patient while he was at [facility].  Dr. Clydene Pugh willing to accept the patient back to [facility] for transfer.  The patient at this time is agreeable.  We will start the transfer process. ____________________________________________   FINAL CLINICAL IMPRESSION(S) / ED DIAGNOSES  Empyema.  Hydropneumothorax.    NEW MEDICATIONS STARTED DURING THIS VISIT:  New Prescriptions   No medications on file     Note:  This document was prepared using Dragon voice recognition software and may include unintentional dictation errors.  Accepted by Dr. Greer Pickerel of the emergency department at [facility].  Patient will remain under IVC for transfer.     Myrna Blazer, MD 09/04/17 1053

## 2017-09-04 NOTE — ED Triage Notes (Signed)
Pt is here under IVC with [employer], pt states him and his wife were arguing this morning because he wouldn't give her any pain pills and she went and took out papers on him. Pt is calm and cooperative in triage,.

## 2017-09-04 NOTE — ED Notes (Signed)
BEHAVIORAL HEALTH ROUNDING Patient sleeping: No. Patient alert and oriented: yes Behavior appropriate: Yes.  ; If no, describe:  Nutrition and fluids offered: yes Toileting and hygiene offered: Yes  Sitter present: q15 minute observations and security monitoring Law enforcement present: Yes    

## 2017-09-04 NOTE — BH Assessment (Addendum)
Assessment Note  Leonard Rogers is an 62 y.o. male. Patient presents to [facility] under IVC with BPD, stating his wife of 30 years filed IVC paperwork due to him not giving her his pain medication. Patient states he has been prescribed pain medication for various medical issues and his wife has been abusing his prescriptions. Patient states all his medication bottles are empty and he refused to get them refilled for his wife. Patient states this has been an ongoing issue for the past 2 years and he has wanted to leave his wife, but has stayed for their 3 adult sons. Patient denied SI, HI, AVH and any psych history.  Patient doesn't have any involvement in the legal system.   Patient denies illicit drug and alcohol use.  Patient  Presents oriented x 4, cooperative, tearful, with a sad affect during assessment.   This Clinical research associate called wife Eduard Penkala for collateral at [phone] and left HIPPA compliant message for return call.   Diagnosis: Depression  Past Medical History:  Past Medical History:  Diagnosis Date  . Anxiety   . Arthritis   . Chronic pain   . Depression   . Difficult intubation   . Hypertension   . Raynaud disease     Past Surgical History:  Procedure Laterality Date  . APPENDECTOMY    . ESOPHAGOGASTRODUODENOSCOPY (EGD) WITH PROPOFOL N/A 03/17/2017   Procedure: ESOPHAGOGASTRODUODENOSCOPY (EGD) WITH PROPOFOL, removal of foreign body;  Surgeon: Toledo, Boykin Nearing, MD;  Location: [facility] ENDOSCOPY;  Service: Gastroenterology;  Laterality: N/A;    Family History:  Family History  Problem Relation Age of Onset  . CAD Mother   . Cirrhosis Father   . CAD Brother     Social History:  reports that he has been smoking.  He has been smoking about 1.00 pack per day. He has never used smokeless tobacco. He reports that he drinks about 4.2 oz of alcohol per week. He reports that he does not use drugs.  Additional Social History:  Alcohol / Drug Use Pain Medications: SEE  PTA  Prescriptions: SEE PTA  Over the Counter: SEE PTA  History of alcohol / drug use?: No history of alcohol / drug abuse Longest period of sobriety (when/how long): None reported   CIWA: CIWA-Ar BP: (!) 149/77 Pulse Rate: (!) 102 COWS:    Allergies:  Allergies  Allergen Reactions  . Ace Inhibitors Anaphylaxis and Other (See Comments)  . Vistaril [Hydroxyzine Hcl]     Unknown per pt    Home Medications:  (Not in a hospital admission)  OB/GYN Status:  No LMP for male patient.  General Assessment Data Assessment unable to be completed: (Assessment completed) Location of Assessment: [facility] ED TTS Assessment: In system Is this a Tele or Face-to-Face Assessment?: Face-to-Face Is this an Initial Assessment or a Re-assessment for this encounter?: Initial Assessment Marital status: Married [place] name: N/A Is patient pregnant?: No Pregnancy Status: No Living Arrangements: Spouse/significant other Can pt return to current living arrangement?: Yes Admission Status: Involuntary Is patient capable of signing voluntary admission?: Yes Referral Source: Self/Family/Friend Insurance type: No Doctor, hospital Exam [facility] Walk-in ONLY) Medical Exam completed: Yes  Crisis Care Plan Living Arrangements: Spouse/significant other Legal Guardian: Other:(Not completed ) Name of Psychiatrist: None reported Name of Therapist: None reported   Education Status Is patient currently in school?: No Is the patient employed, unemployed or receiving disability?: Employed  Risk to self with the past 6 months Suicidal Ideation: No Has patient been  a risk to self within the past 6 months prior to admission? : No Suicidal Intent: No Has patient had any suicidal intent within the past 6 months prior to admission? : No Is patient at risk for suicide?: No Suicidal Plan?: No Has patient had any suicidal plan within the past 6 months prior to admission? : No Access to Means: No What has  been your use of drugs/alcohol within the last 12 months?: None reported Previous Attempts/Gestures: No How many times?: 0 Other Self Harm Risks: None reported Triggers for Past Attempts: Other (Comment) Intentional Self Injurious Behavior: None Family Suicide History: No Recent stressful life event(s): Conflict (Comment)(wife is addicted to pain pills) Persecutory voices/beliefs?: No Depression: No Depression Symptoms: (None reported) Substance abuse history and/or treatment for substance abuse?: No Suicide prevention information given to non-admitted patients: Not applicable  Risk to Others within the past 6 months Homicidal Ideation: No Does patient have any lifetime risk of violence toward others beyond the six months prior to admission? : No Thoughts of Harm to Others: No Current Homicidal Intent: No Current Homicidal Plan: No Access to Homicidal Means: No Identified Victim: None reported History of harm to others?: No Assessment of Violence: None Noted Violent Behavior Description: None reported Does patient have access to weapons?: No Criminal Charges Pending?: No Does patient have a court date: No Is patient on probation?: No  Psychosis Hallucinations: None noted Delusions: None noted  Mental Status Report Appearance/Hygiene: In scrubs Eye Contact: Fair Motor Activity: Unremarkable Speech: Pressured Level of Consciousness: Alert Mood: Sad Affect: Sad Anxiety Level: None Thought Processes: Circumstantial Judgement: Unimpaired Orientation: Person, Place, Time, Situation, Appropriate for developmental age Obsessive Compulsive Thoughts/Behaviors: None  Cognitive Functioning Concentration: Normal Memory: Recent Intact, Remote Intact Is patient IDD: No Is patient DD?: No Insight: Good Impulse Control: Good Appetite: Good Have you had any weight changes? : No Change Sleep: No Change Total Hours of Sleep: 8 Vegetative Symptoms: None  ADLScreening [facility]  Assessment Services) Patient's cognitive ability adequate to safely complete daily activities?: Yes Patient able to express need for assistance with ADLs?: Yes Independently performs ADLs?: Yes (appropriate for developmental age)  Prior Inpatient Therapy Prior Inpatient Therapy: No  Prior Outpatient Therapy Prior Outpatient Therapy: No Does patient have an ACCT team?: No Does patient have Intensive In-House Services?  : No Does patient have [facility] services? : No Does patient have P4CC services?: No  ADL Screening (condition at time of admission) Patient's cognitive ability adequate to safely complete daily activities?: Yes Is the patient deaf or have difficulty hearing?: No Does the patient have difficulty seeing, even when wearing glasses/contacts?: No Does the patient have difficulty concentrating, remembering, or making decisions?: No Patient able to express need for assistance with ADLs?: Yes Does the patient have difficulty dressing or bathing?: No Independently performs ADLs?: Yes (appropriate for developmental age) Does the patient have difficulty walking or climbing stairs?: No Weakness of Legs: None Weakness of Arms/Hands: None  Home Assistive Devices/Equipment Home Assistive Devices/Equipment: None  Therapy Consults (therapy consults require a physician order) PT Evaluation Needed: No OT Evalulation Needed: No SLP Evaluation Needed: No Abuse/Neglect Assessment (Assessment to be complete while patient is alone) Abuse/Neglect Assessment Can Be Completed: Yes Physical Abuse: Denies Verbal Abuse: Denies Sexual Abuse: Denies Exploitation of patient/patient's resources: Denies Self-Neglect: Denies Values / Beliefs Cultural Requests During Hospitalization: None Spiritual Requests During Hospitalization: None Consults Spiritual Care Consult Needed: No Social Work Consult Needed: No  Disposition:  Disposition Initial Assessment Completed for this  Encounter: Yes Patient referred to: Other (Comment)(pending psych consult )  On Site Evaluation by:   Reviewed with Physician:    Galen Manila, LPC, LCAS-A 09/04/2017 10:51 AM

## 2017-09-04 NOTE — ED Notes (Signed)
Patient going to ct.

## 2017-11-02 ENCOUNTER — Telehealth: Payer: Self-pay

## 2017-11-02 NOTE — Telephone Encounter (Signed)
Not a patient of [facility] °

## 2018-07-24 ENCOUNTER — Encounter: Payer: Self-pay | Admitting: *Deleted

## 2018-07-24 ENCOUNTER — Emergency Department: Payer: Medicaid Other

## 2018-07-24 ENCOUNTER — Emergency Department
Admission: EM | Admit: 2018-07-24 | Discharge: 2018-07-24 | Discharge status: Against Medical Advice | Payer: Medicaid Other | Source: Home / Self Care | Attending: Emergency Medicine | Admitting: Emergency Medicine

## 2018-07-24 ENCOUNTER — Other Ambulatory Visit: Payer: Self-pay

## 2018-07-24 DIAGNOSIS — R000 Tachycardia, unspecified: Secondary | ICD-10-CM

## 2018-07-24 DIAGNOSIS — J9601 Acute respiratory failure with hypoxia: Secondary | ICD-10-CM | POA: Insufficient documentation

## 2018-07-24 DIAGNOSIS — Z1159 Encounter for screening for other viral diseases: Secondary | ICD-10-CM | POA: Insufficient documentation

## 2018-07-24 DIAGNOSIS — I10 Essential (primary) hypertension: Secondary | ICD-10-CM | POA: Insufficient documentation

## 2018-07-24 DIAGNOSIS — Z5329 Procedure and treatment not carried out because of patient's decision for other reasons: Secondary | ICD-10-CM | POA: Insufficient documentation

## 2018-07-24 DIAGNOSIS — F17200 Nicotine dependence, unspecified, uncomplicated: Secondary | ICD-10-CM | POA: Insufficient documentation

## 2018-07-24 DIAGNOSIS — J9602 Acute respiratory failure with hypercapnia: Secondary | ICD-10-CM

## 2018-07-24 DIAGNOSIS — Z79899 Other long term (current) drug therapy: Secondary | ICD-10-CM | POA: Insufficient documentation

## 2018-07-24 LAB — BLOOD GAS, VENOUS
Acid-Base Excess: 6.2 mmol/L — ABNORMAL HIGH (ref 0.0–2.0)
Bicarbonate: 37.6 mmol/L — ABNORMAL HIGH (ref 20.0–28.0)
O2 Saturation: 29.6 %
Patient temperature: 37
pCO2, Ven: 94 mmHg (ref 44.0–60.0)
pH, Ven: 7.21 — ABNORMAL LOW (ref 7.250–7.430)
pO2, Ven: <31 mmHg — CL (ref 32.0–45.0)

## 2018-07-24 LAB — BLOOD GAS, ARTERIAL
Acid-Base Excess: 6.8 mmol/L — ABNORMAL HIGH (ref 0.0–2.0)
Bicarbonate: 35.9 mmol/L — ABNORMAL HIGH (ref 20.0–28.0)
FIO2: 0.28
O2 Saturation: 95.3 %
Patient temperature: 37
pCO2 arterial: 73 mmHg (ref 32.0–48.0)
pH, Arterial: 7.3 — ABNORMAL LOW (ref 7.350–7.450)
pO2, Arterial: 85 mmHg (ref 83.0–108.0)

## 2018-07-24 LAB — CBC WITH DIFFERENTIAL/PLATELET
Abs Immature Granulocytes: 0.09 10*3/uL — ABNORMAL HIGH (ref 0.00–0.07)
Basophils Absolute: 0.1 10*3/uL (ref 0.0–0.1)
Basophils Relative: 0 %
Eosinophils Absolute: 0.1 10*3/uL (ref 0.0–0.5)
Eosinophils Relative: 1 %
HCT: 39.8 % (ref 39.0–52.0)
Hemoglobin: 12.6 g/dL — ABNORMAL LOW (ref 13.0–17.0)
Immature Granulocytes: 1 %
Lymphocytes Relative: 6 %
Lymphs Abs: 1 10*3/uL (ref 0.7–4.0)
MCH: 28.4 pg (ref 26.0–34.0)
MCHC: 31.7 g/dL (ref 30.0–36.0)
MCV: 89.8 fL (ref 80.0–100.0)
Monocytes Absolute: 1 10*3/uL (ref 0.1–1.0)
Monocytes Relative: 6 %
Neutro Abs: 15.3 10*3/uL — ABNORMAL HIGH (ref 1.7–7.7)
Neutrophils Relative %: 86 %
Platelets: 272 10*3/uL (ref 150–400)
RBC: 4.43 MIL/uL (ref 4.22–5.81)
RDW: 12.5 % (ref 11.5–15.5)
WBC: 17.6 10*3/uL — ABNORMAL HIGH (ref 4.0–10.5)
nRBC: 0 % (ref 0.0–0.2)

## 2018-07-24 LAB — COMPREHENSIVE METABOLIC PANEL
ALT: 26 U/L (ref 0–44)
AST: 16 U/L (ref 15–41)
Albumin: 3.9 g/dL (ref 3.5–5.0)
Alkaline Phosphatase: 68 U/L (ref 38–126)
Anion gap: 8 (ref 5–15)
BUN: 19 mg/dL (ref 6–20)
CO2: 34 mmol/L — ABNORMAL HIGH (ref 22–32)
Calcium: 8.8 mg/dL — ABNORMAL LOW (ref 8.9–10.3)
Chloride: 94 mmol/L — ABNORMAL LOW (ref 98–111)
Creatinine, Ser: 0.78 mg/dL (ref 0.61–1.24)
GFR calc Af Amer: >60 mL/min (ref >60)
GFR calc non Af Amer: >60 mL/min (ref >60)
Glucose, Bld: 157 mg/dL — ABNORMAL HIGH (ref 70–99)
Potassium: 3.5 mmol/L (ref 3.5–5.1)
Sodium: 136 mmol/L (ref 135–145)
Total Bilirubin: 0.2 mg/dL — ABNORMAL LOW (ref 0.3–1.2)
Total Protein: 7.3 g/dL (ref 6.5–8.1)

## 2018-07-24 LAB — URINE DRUG SCREEN, QUALITATIVE (ARMC ONLY)
Amphetamines, Ur Screen: POSITIVE — AB
Barbiturates, Ur Screen: NOT DETECTED
Benzodiazepine, Ur Scrn: POSITIVE — AB
Cannabinoid 50 Ng, Ur ~~LOC~~: NOT DETECTED
Cocaine Metabolite,Ur ~~LOC~~: NOT DETECTED
MDMA (Ecstasy)Ur Screen: NOT DETECTED
Methadone Scn, Ur: NOT DETECTED
Opiate, Ur Screen: NOT DETECTED
Phencyclidine (PCP) Ur S: NOT DETECTED
Tricyclic, Ur Screen: NOT DETECTED

## 2018-07-24 LAB — LACTIC ACID, PLASMA: Lactic Acid, Venous: 1.2 mmol/L (ref 0.5–1.9)

## 2018-07-24 LAB — SARS CORONAVIRUS 2 BY RT PCR (HOSPITAL ORDER, PERFORMED IN ~~LOC~~ HOSPITAL LAB): SARS Coronavirus 2: NEGATIVE

## 2018-07-24 LAB — URINALYSIS, ROUTINE W REFLEX MICROSCOPIC
Bilirubin Urine: NEGATIVE
Glucose, UA: 50 mg/dL — AB
Hgb urine dipstick: NEGATIVE
Ketones, ur: NEGATIVE mg/dL
Leukocytes,Ua: NEGATIVE
Nitrite: NEGATIVE
Protein, ur: NEGATIVE mg/dL
Specific Gravity, Urine: 1.014 (ref 1.005–1.030)
pH: 5 (ref 5.0–8.0)

## 2018-07-24 LAB — ETHANOL: Alcohol, Ethyl (B): <10 mg/dL (ref <10)

## 2018-07-24 LAB — BRAIN NATRIURETIC PEPTIDE: B Natriuretic Peptide: 73 pg/mL (ref 0.0–100.0)

## 2018-07-24 LAB — TROPONIN I: Troponin I: <0.03 ng/mL (ref <0.03)

## 2018-07-24 LAB — MAGNESIUM: Magnesium: 2.2 mg/dL (ref 1.7–2.4)

## 2018-07-24 LAB — BETA-HYDROXYBUTYRIC ACID: Beta-Hydroxybutyric Acid: 0.1 mmol/L (ref 0.05–0.27)

## 2018-07-24 MED ORDER — SODIUM CHLORIDE 0.9 % IV BOLUS
1000.0000 mL | Freq: Once | INTRAVENOUS | Status: AC
  Administered 2018-07-24: 1000 mL via INTRAVENOUS

## 2018-07-24 MED ORDER — IOHEXOL 350 MG/ML SOLN
75.0000 mL | Freq: Once | INTRAVENOUS | Status: AC | PRN
  Administered 2018-07-24: 75 mL via INTRAVENOUS

## 2018-07-24 NOTE — ED Notes (Addendum)
Pt states he was supposed to follow up with pulmonary care and has not but now he has Medicare and states he will. Pt aware of low o2 sats without oxygen but still insistent on going home

## 2018-07-24 NOTE — ED Triage Notes (Signed)
Per EMS called out to house because pt was not responding to his wife. She states he often takes too much Xanax. Pt is sleepy but yelling at times that he wants a blanket. Sat was low on EMS arrival. O2 4L placed. Here sat 100% on 4L. Turned to 2L 100%

## 2018-07-24 NOTE — ED Provider Notes (Signed)
[facility] Emergency Department Provider Note  ____________________________________________   First MD Initiated Contact with Patient 07/24/18 0112     (approximate)  I have reviewed the triage vital signs and the nursing notes.   HISTORY  Chief Complaint Altered Mental Status  Level 5 caveat:  history/ROS limited by acute/critical illness  HPI Leonard Rogers is a 63 y.o. male who apparently has a history that includes chronic pain and both opioid and benzodiazepine use as well as possible diabetes.  He presents by EMS for evaluation of unresponsiveness.  His wife called 911 because he was apparently not responding to her.  She says that he usually takes a lot of Xanax and has accidentally taken too much in the past.  The patient is very somnolent but arousable to physical stimuli and loud voices.  When he is awakened he is a bit churlish, yelling at staff and demanding a blanket, but then he goes back to sleep.  He is unable to respond sufficiently to give any real review of systems, but he denies any pain and he denies feeling short of breath.  No obvious signs of trauma.  No other details are available at this time.        Past Medical History:  Diagnosis Date   Anxiety    Arthritis    Chronic pain    Depression    Difficult intubation    Hypertension    Raynaud disease     Patient Active Problem List   Diagnosis Date Noted   Chest pain 02/15/2016    Past Surgical History:  Procedure Laterality Date   APPENDECTOMY     ESOPHAGOGASTRODUODENOSCOPY (EGD) WITH PROPOFOL N/A 03/17/2017   Procedure: ESOPHAGOGASTRODUODENOSCOPY (EGD) WITH PROPOFOL, removal of foreign body;  Surgeon: Toledo, Boykin Nearing, MD;  Location: [facility] ENDOSCOPY;  Service: Gastroenterology;  Laterality: N/A;    Prior to Admission medications   Medication Sig Start Date End Date Taking? Authorizing Provider  amLODipine (NORVASC) 10 MG tablet Take 10 mg by mouth daily.     [provider]  hydrochlorothiazide (HYDRODIURIL) 25 MG tablet Take 25 mg by mouth daily.    [provider]  metoprolol tartrate (LOPRESSOR) 25 MG tablet Take 25 mg by mouth 2 (two) times daily.    [provider]  oxyCODONE-acetaminophen (PERCOCET) 10-325 MG tablet Take 1 tablet by mouth 4 (four) times daily. 08/11/17   [provider]    Allergies Ace inhibitors and Vistaril [hydroxyzine hcl]  Family History  Problem Relation Age of Onset   CAD Mother    Cirrhosis Father    CAD Brother     Social History Social History   Tobacco Use   Smoking status: Current Every Day Smoker    Packs/day: 1.00   Smokeless tobacco: Never Used  Substance Use Topics   Alcohol use: Yes    Alcohol/week: 7.0 standard drinks    Types: 7 Cans of beer per week   Drug use: No    Review of Systems Level 5 caveat:  history/ROS limited by acute/critical illness ____________________________________________   PHYSICAL EXAM:  VITAL SIGNS: ED Triage Vitals  Enc Vitals Group     BP 07/24/18 0122 115/81     Pulse Rate 07/24/18 0122 (!) 127     Resp 07/24/18 0130 12     Temp 07/24/18 0122 97.8 F (36.6 C)     Temp Source 07/24/18 0122 Oral     SpO2 07/24/18 0122 100 %  Weight --      Height --      Head Circumference --      Peak Flow --      Pain Score 07/24/18 0122 0     Pain Loc --      Pain Edu? --      Excl. in GC? --     Constitutional: Somnolent but no acute distress. Eyes: Conjunctivae are normal.  Pupils are sluggish but equally reactive. Head: Atraumatic. Nose: No congestion/rhinnorhea. Mouth/Throat: Mucous membranes are dry. Neck: No stridor.  No meningeal signs.   Cardiovascular: Tachycardia between about 125 and 140, regular rhythm. Good peripheral circulation. Grossly normal heart sounds. Respiratory: Normal respiratory effort.  No retractions. No audible wheezing. Gastrointestinal: Soft and nontender. No distention.    Musculoskeletal: No lower extremity tenderness nor edema. No gross deformities of extremities. Neurologic: Moving all 4 extremities but unable to participate in neurological exam.  Slurred speech when awakened. Skin:  Skin is warm, dry and intact. No rash noted.   ____________________________________________   LABS (all labs ordered are listed, but only abnormal results are displayed)  Labs Reviewed  URINALYSIS, ROUTINE W REFLEX MICROSCOPIC - Abnormal; Notable for the following components:      Result Value   Color, Urine YELLOW (*)    APPearance HAZY (*)    Glucose, UA 50 (*)    All other components within normal limits  URINE DRUG SCREEN, QUALITATIVE ([facility] ONLY) - Abnormal; Notable for the following components:   Amphetamines, Ur Screen POSITIVE (*)    Benzodiazepine, Ur Scrn POSITIVE (*)    All other components within normal limits  CBC WITH DIFFERENTIAL/PLATELET - Abnormal; Notable for the following components:   WBC 17.6 (*)    Hemoglobin 12.6 (*)    Neutro Abs 15.3 (*)    Abs Immature Granulocytes 0.09 (*)    All other components within normal limits  COMPREHENSIVE METABOLIC PANEL - Abnormal; Notable for the following components:   Chloride 94 (*)    CO2 34 (*)    Glucose, Bld 157 (*)    Calcium 8.8 (*)    Total Bilirubin 0.2 (*)    All other components within normal limits  BLOOD GAS, VENOUS - Abnormal; Notable for the following components:   pH, Ven 7.21 (*)    pCO2, Ven 94 (*)    pO2, Ven <31.0 (*)    Bicarbonate 37.6 (*)    Acid-Base Excess 6.2 (*)    All other components within normal limits  BLOOD GAS, ARTERIAL - Abnormal; Notable for the following components:   pH, Arterial 7.30 (*)    pCO2 arterial 73 (*)    Bicarbonate 35.9 (*)    Acid-Base Excess 6.8 (*)    All other components within normal limits  SARS CORONAVIRUS 2 (HOSPITAL ORDER, PERFORMED IN [facility])  ETHANOL  TROPONIN I  LACTIC ACID, PLASMA  BRAIN NATRIURETIC PEPTIDE   MAGNESIUM  BETA-HYDROXYBUTYRIC ACID  LACTIC ACID, PLASMA   ____________________________________________  EKG  ED ECG REPORT I, Loleta Rose, the attending physician, personally viewed and interpreted this ECG.  Date: 07/24/2018 EKG Time: 1:09 AM Rate: 137 Rhythm: Sinus tachycardia QRS Axis: normal Intervals: normal ST/T Wave abnormalities: Non-specific ST segment / T-wave changes, but no clear evidence of acute ischemia. Narrative Interpretation: no definitive evidence of acute ischemia; does not meet STEMI criteria.   ____________________________________________  RADIOLOGY I, Loleta Rose, personally viewed and evaluated these images (plain radiographs) as part of my medical decision  making, as well as reviewing the written report by the radiologist.  ED MD interpretation: No acute abnormalities on CTA chest nor chest x-ray.  Official radiology report(s): Ct Angio Chest Pe W And/or Wo Contrast  Result Date: 07/24/2018 CLINICAL DATA:  Shortness of breath EXAM: CT ANGIOGRAPHY CHEST WITH CONTRAST TECHNIQUE: Multidetector CT imaging of the chest was performed using the standard protocol during bolus administration of intravenous contrast. Multiplanar CT image reconstructions and MIPs were obtained to evaluate the vascular anatomy. CONTRAST:  75mL OMNIPAQUE IOHEXOL 350 MG/ML SOLN COMPARISON:  Chest x-ray 07/24/2018 FINDINGS: Cardiovascular: No filling defects in the pulmonary arteries to suggest pulmonary emboli. Coronary artery calcifications in the left anterior descending coronary artery. Scattered aortic calcifications. Heart is normal size. Aorta is normal caliber. Mediastinum/Nodes: Mildly prominent right hilar lymph nodes with a short axis diameter of 13 mm. Left hilar lymph node has a short axis diameter of 9 mm. Subcarinal lymph node has a short axis diameter of 11 mm. No axillary adenopathy. Lungs/Pleura: Postoperative changes in the right hemithorax. Predominantly linear  densities in the right lower lobe likely reflects scarring. There is a small gas collection peripherally in the area of scarring which could be within the pleural space. Linear scarring or atelectasis at the left base. No effusions. Upper Abdomen: Imaging into the upper abdomen shows no acute findings. Musculoskeletal: Chest wall soft tissues are unremarkable. No acute bony abnormality. Prior resection of the posterior right 7th and 8th ribs. Review of the MIP images confirms the above findings. IMPRESSION: Postoperative changes on the right with linear peripheral densities in the right lower lobe, likely scarring. There is a small rounded gas collection peripherally in the area of scarring which could be within the pleural space and represent a small loculated pneumothorax. This is likely not clinically significant. Mildly prominent bilateral hilar and mediastinal lymph nodes, likely reactive. Coronary artery disease. No evidence of pulmonary embolus. Aortic Atherosclerosis (ICD10-I70.0). Electronically Signed   By: Charlett NoseKevin  Dover M.D.   On: 07/24/2018 03:56   Dg Chest Portable 1 View  Result Date: 07/24/2018 CLINICAL DATA:  Hypoxemia EXAM: PORTABLE CHEST 1 VIEW COMPARISON:  09/04/2017 FINDINGS: Heart is normal size. There are multiple right rib fractures and apparent posterior right rib resection. Predominantly linear densities in the right mid and lower lung could reflect postoperative scarring. No confluent opacity on the left. No effusions or acute bony abnormality. IMPRESSION: Interval postoperative changes on the right since prior study with posterior right rib resection. Probable scarring in the right mid and lower lung. No acute findings. Electronically Signed   By: Charlett NoseKevin  Dover M.D.   On: 07/24/2018 01:54    ____________________________________________   PROCEDURES   Procedure(s) performed (including Critical Care):  Procedures   ____________________________________________   INITIAL  IMPRESSION / MDM / ASSESSMENT AND PLAN / ED COURSE  As part of my medical decision making, I reviewed the following data within the electronic MEDICAL RECORD NUMBER Nursing notes reviewed and incorporated, Labs reviewed , EKG interpreted , Old chart reviewed, Radiograph reviewed , Notes from prior ED visits and [place] Controlled Substance Database      *Edd FabianJoseph Moorman was evaluated in Emergency Department on 07/24/2018 for the symptoms described in the history of present illness. He was evaluated in the context of the global COVID-19 pandemic, which necessitated consideration that the patient might be at risk for infection with the SARS-CoV-2 virus that causes COVID-19. Institutional protocols and algorithms that pertain to the evaluation of patients at risk for COVID-19  are in a state of rapid change based on information released by regulatory bodies including the CDC and federal and state organizations. These policies and algorithms were followed during the patient's care in the ED.*  Differential diagnosis includes, but is not limited to, accidental versus intentional drug overdose of benzodiazepines, opiates, or other substance; DKA versus HHNC; acute infection; intracranial bleeding; other metabolic or electrolyte abnormality.  Chest x-ray is pending, no fever, no hypoxemia but significant somnolence.  Broad laboratory work-up is pending but at this point I am mostly concerned about the possibility of DKA given his tachycardia and a reportedly high blood sugar by EMS.  I have ordered 1 L normal saline.  After we send off labs including COVID-19 test and blood work, I will likely send him for a CT scan of his head given his decreased responsiveness.  Clinical Course as of Jul 23 416  Tue Jul 24, 2018  0248 Beta-Hydroxybutyric Acid: 0.10 [CF]  0248 otherwise unremarkable UA  Glucose, UA(!): 50 [CF]  0248 Normal lactic acid  Lactic Acid, Venous: 1.2 [CF]  0249 Alcohol, Ethyl (B): <10 [CF]  0249 VBG is  so abnormal that I suspect erroneous results.  Asked RT to perform ABG.  The patient is more awake and alert, ambulates without difficulty; highly doubt pCO2 of 94.  Blood gas, venous(!!) [CF]  0249 No acute/emergent findings on CXR  DG Chest Portable 1 View [CF]  0300 This is more reasonable though still elevated, but the patient is not in distress and more awake.  Awaiting COVID-19 results, anticipate Bipap if covid-19 is negative  pCO2 arterial(!!): 73 [CF]  0308 Essentially normal CMP  Comprehensive metabolic panel(!) [CF]  0322 Benzodiazepine, Ur Scrn(!): POSITIVE [CF]  0322 Amphetamines, Ur Screen(!): POSITIVE [CF]  0348 SARS Coronavirus 2: NEGATIVE [CF]  0412 No acute abnormalities identified on CTA chest.  CT Angio Chest PE W and/or Wo Contrast [CF]  1610 I reassessed the patient.  His heart rate was down to about 110, but then I went in to talk to him and he is in no distress, completely wide awake, conversant, having a normal conversation.  I explained to him about his PCO2 to the best of my ability and in layman's terms, as well as the generally reassuring results of his other test.  I recommended that we start him on BiPAP and admitted to the hospital (although he is not short of breath clinically), and his heart rate went up to the 150s to 160s.  He said he cannot stay in the hospital, that he has been hospitalized for an extended period of time before and he cannot do it again.  I had my usual and customary AMA discussion with him including my concerns that I cannot explain his symptoms, the fact that he seems to not ventilating well, and that he needs further evaluation for his rapid heartbeat as well as for his breathing.  He adamantly refuses.  He understands that this could lead to permanent disability or death but he refuses to stay, and in my opinion he has the capacity to make his own decisions.  I gave him strict precautions to follow-up with his regular doctor and he says he will  call them in the morning.  I reminded him he can return to the ED at any time for further evaluation and he agrees with the plan.  He is ambulatory in no respiratory distress and with no apparent issues.   [CF]    Clinical  Course User Index [CF] Loleta Rose, MD     ____________________________________________  FINAL CLINICAL IMPRESSION(S) / ED DIAGNOSES  Final diagnoses:  Acute respiratory failure with hypoxia and hypercapnia (HCC)  Tachycardia     MEDICATIONS GIVEN DURING THIS VISIT:  Medications  sodium chloride 0.9 % bolus 1,000 mL (1,000 mLs Intravenous New Bag/Given 07/24/18 0234)  iohexol (OMNIPAQUE) 350 MG/ML injection 75 mL (75 mLs Intravenous Contrast Given 07/24/18 0335)     ED Discharge Orders    None       Note:  This document was prepared using Dragon voice recognition software and may include unintentional dictation errors.   Loleta Rose, MD 07/24/18 [phone]

## 2018-07-24 NOTE — Discharge Instructions (Addendum)
As we discussed, your work-up tonight shows that you are not breathing well and have too much carbon dioxide in your system.  Your heart rate is also too high.  Although we did not identify a specific reason, and your CT scan of your chest and your coronavirus test were both reassuring, I offered to admit you to the hospital for help with your breathing and heart rate.  You are refusing additional treatment at admission, which is your right to do so, but remember that undiagnosed breathing and heart problems can lead to permanent disability and death.  You may return at any point to the emergency department for a reevaluation to determine if anything has changed or if you need any additional treatment.  In the meantime, drink plenty of fluids and take your regular medicines but make sure you do not take too much Valium, avoid the Adderall, and follow-up with your doctor with a phone call this morning.

## 2018-07-25 ENCOUNTER — Other Ambulatory Visit: Payer: Self-pay

## 2018-07-25 ENCOUNTER — Inpatient Hospital Stay
Admission: EM | Admit: 2018-07-25 | Discharge: 2018-07-26 | DRG: 917 | Disposition: A | Discharge status: Home / Self Care | Payer: Medicaid Other | Attending: Internal Medicine | Admitting: Internal Medicine

## 2018-07-25 ENCOUNTER — Emergency Department: Payer: Medicaid Other

## 2018-07-25 DIAGNOSIS — J9621 Acute and chronic respiratory failure with hypoxia: Secondary | ICD-10-CM | POA: Diagnosis present

## 2018-07-25 DIAGNOSIS — Z79891 Long term (current) use of opiate analgesic: Secondary | ICD-10-CM

## 2018-07-25 DIAGNOSIS — E876 Hypokalemia: Secondary | ICD-10-CM | POA: Diagnosis present

## 2018-07-25 DIAGNOSIS — J439 Emphysema, unspecified: Secondary | ICD-10-CM | POA: Diagnosis present

## 2018-07-25 DIAGNOSIS — D649 Anemia, unspecified: Secondary | ICD-10-CM | POA: Diagnosis present

## 2018-07-25 DIAGNOSIS — F419 Anxiety disorder, unspecified: Secondary | ICD-10-CM | POA: Diagnosis present

## 2018-07-25 DIAGNOSIS — J9622 Acute and chronic respiratory failure with hypercapnia: Secondary | ICD-10-CM | POA: Diagnosis present

## 2018-07-25 DIAGNOSIS — Z20828 Contact with and (suspected) exposure to other viral communicable diseases: Secondary | ICD-10-CM | POA: Diagnosis present

## 2018-07-25 DIAGNOSIS — I10 Essential (primary) hypertension: Secondary | ICD-10-CM | POA: Diagnosis present

## 2018-07-25 DIAGNOSIS — I7300 Raynaud's syndrome without gangrene: Secondary | ICD-10-CM | POA: Diagnosis present

## 2018-07-25 DIAGNOSIS — T424X1A Poisoning by benzodiazepines, accidental (unintentional), initial encounter: Principal | ICD-10-CM | POA: Diagnosis present

## 2018-07-25 DIAGNOSIS — J9601 Acute respiratory failure with hypoxia: Secondary | ICD-10-CM

## 2018-07-25 DIAGNOSIS — R739 Hyperglycemia, unspecified: Secondary | ICD-10-CM | POA: Diagnosis present

## 2018-07-25 DIAGNOSIS — F1310 Sedative, hypnotic or anxiolytic abuse, uncomplicated: Secondary | ICD-10-CM | POA: Diagnosis present

## 2018-07-25 DIAGNOSIS — Z888 Allergy status to other drugs, medicaments and biological substances status: Secondary | ICD-10-CM

## 2018-07-25 DIAGNOSIS — Z87898 Personal history of other specified conditions: Secondary | ICD-10-CM

## 2018-07-25 DIAGNOSIS — G8929 Other chronic pain: Secondary | ICD-10-CM | POA: Diagnosis present

## 2018-07-25 DIAGNOSIS — F329 Major depressive disorder, single episode, unspecified: Secondary | ICD-10-CM | POA: Diagnosis present

## 2018-07-25 DIAGNOSIS — J9602 Acute respiratory failure with hypercapnia: Principal | ICD-10-CM

## 2018-07-25 DIAGNOSIS — R092 Respiratory arrest: Secondary | ICD-10-CM

## 2018-07-25 DIAGNOSIS — Z79899 Other long term (current) drug therapy: Secondary | ICD-10-CM

## 2018-07-25 DIAGNOSIS — G9341 Metabolic encephalopathy: Secondary | ICD-10-CM | POA: Diagnosis present

## 2018-07-25 DIAGNOSIS — F17210 Nicotine dependence, cigarettes, uncomplicated: Secondary | ICD-10-CM | POA: Diagnosis present

## 2018-07-25 DIAGNOSIS — Z8249 Family history of ischemic heart disease and other diseases of the circulatory system: Secondary | ICD-10-CM | POA: Diagnosis not present

## 2018-07-25 DIAGNOSIS — J9600 Acute respiratory failure, unspecified whether with hypoxia or hypercapnia: Secondary | ICD-10-CM

## 2018-07-25 LAB — CBC WITH DIFFERENTIAL/PLATELET
Abs Immature Granulocytes: 0.04 10*3/uL (ref 0.00–0.07)
Basophils Absolute: 0 10*3/uL (ref 0.0–0.1)
Basophils Relative: 0 %
Eosinophils Absolute: 0 10*3/uL (ref 0.0–0.5)
Eosinophils Relative: 0 %
HCT: 39.4 % (ref 39.0–52.0)
Hemoglobin: 12.5 g/dL — ABNORMAL LOW (ref 13.0–17.0)
Immature Granulocytes: 0 %
Lymphocytes Relative: 24 %
Lymphs Abs: 2.3 10*3/uL (ref 0.7–4.0)
MCH: 28.9 pg (ref 26.0–34.0)
MCHC: 31.7 g/dL (ref 30.0–36.0)
MCV: 91 fL (ref 80.0–100.0)
Monocytes Absolute: 0.9 10*3/uL (ref 0.1–1.0)
Monocytes Relative: 9 %
Neutro Abs: 6.4 10*3/uL (ref 1.7–7.7)
Neutrophils Relative %: 67 %
Platelets: 274 10*3/uL (ref 150–400)
RBC: 4.33 MIL/uL (ref 4.22–5.81)
RDW: 12.1 % (ref 11.5–15.5)
WBC: 9.7 10*3/uL (ref 4.0–10.5)
nRBC: 0 % (ref 0.0–0.2)

## 2018-07-25 LAB — COMPREHENSIVE METABOLIC PANEL
ALT: 57 U/L — ABNORMAL HIGH (ref 0–44)
AST: 72 U/L — ABNORMAL HIGH (ref 15–41)
Albumin: 3.8 g/dL (ref 3.5–5.0)
Alkaline Phosphatase: 87 U/L (ref 38–126)
Anion gap: 13 (ref 5–15)
BUN: 14 mg/dL (ref 6–20)
CO2: 28 mmol/L (ref 22–32)
Calcium: 8.6 mg/dL — ABNORMAL LOW (ref 8.9–10.3)
Chloride: 95 mmol/L — ABNORMAL LOW (ref 98–111)
Creatinine, Ser: 0.81 mg/dL (ref 0.61–1.24)
GFR calc Af Amer: >60 mL/min (ref >60)
GFR calc non Af Amer: >60 mL/min (ref >60)
Glucose, Bld: 129 mg/dL — ABNORMAL HIGH (ref 70–99)
Potassium: 2.8 mmol/L — ABNORMAL LOW (ref 3.5–5.1)
Sodium: 136 mmol/L (ref 135–145)
Total Bilirubin: 0.8 mg/dL (ref 0.3–1.2)
Total Protein: 7.3 g/dL (ref 6.5–8.1)

## 2018-07-25 LAB — BLOOD GAS, ARTERIAL
Acid-Base Excess: 1.2 mmol/L (ref 0.0–2.0)
Acid-Base Excess: 2.9 mmol/L — ABNORMAL HIGH (ref 0.0–2.0)
Bicarbonate: 25.9 mmol/L (ref 20.0–28.0)
Bicarbonate: 28.7 mmol/L — ABNORMAL HIGH (ref 20.0–28.0)
FIO2: 1
FIO2: 40
MECHVT: 500 mL
MECHVT: 500 mL
Mechanical Rate: 20
O2 Saturation: 100 %
O2 Saturation: 99.4 %
PEEP: 5 cmH2O
PEEP: 5 cmH2O
Patient temperature: 37
Patient temperature: 37
RATE: 20 resp/min
pCO2 arterial: 34 mmHg (ref 32.0–48.0)
pCO2 arterial: 57 mmHg — ABNORMAL HIGH (ref 32.0–48.0)
pH, Arterial: 7.31 — ABNORMAL LOW (ref 7.350–7.450)
pH, Arterial: 7.49 — ABNORMAL HIGH (ref 7.350–7.450)
pO2, Arterial: 143 mmHg — ABNORMAL HIGH (ref 83.0–108.0)
pO2, Arterial: 477 mmHg — ABNORMAL HIGH (ref 83.0–108.0)

## 2018-07-25 LAB — BASIC METABOLIC PANEL
Anion gap: 6 (ref 5–15)
BUN: 11 mg/dL (ref 6–20)
CO2: 26 mmol/L (ref 22–32)
Calcium: 8.1 mg/dL — ABNORMAL LOW (ref 8.9–10.3)
Chloride: 108 mmol/L (ref 98–111)
Creatinine, Ser: 0.5 mg/dL — ABNORMAL LOW (ref 0.61–1.24)
GFR calc Af Amer: >60 mL/min (ref >60)
GFR calc non Af Amer: >60 mL/min (ref >60)
Glucose, Bld: 86 mg/dL (ref 70–99)
Potassium: 3.1 mmol/L — ABNORMAL LOW (ref 3.5–5.1)
Sodium: 140 mmol/L (ref 135–145)

## 2018-07-25 LAB — PHOSPHORUS
Phosphorus: <1 mg/dL — CL (ref 2.5–4.6)
Phosphorus: 2.7 mg/dL (ref 2.5–4.6)

## 2018-07-25 LAB — URINALYSIS, ROUTINE W REFLEX MICROSCOPIC
Bilirubin Urine: NEGATIVE
Glucose, UA: NEGATIVE mg/dL
Hgb urine dipstick: NEGATIVE
Ketones, ur: 80 mg/dL — AB
Leukocytes,Ua: NEGATIVE
Nitrite: NEGATIVE
Protein, ur: 100 mg/dL — AB
Specific Gravity, Urine: 1.017 (ref 1.005–1.030)
pH: 5 (ref 5.0–8.0)

## 2018-07-25 LAB — TSH: TSH: 0.141 u[IU]/mL — ABNORMAL LOW (ref 0.350–4.500)

## 2018-07-25 LAB — URINE DRUG SCREEN, QUALITATIVE (ARMC ONLY)
Amphetamines, Ur Screen: POSITIVE — AB
Barbiturates, Ur Screen: NOT DETECTED
Benzodiazepine, Ur Scrn: POSITIVE — AB
Cannabinoid 50 Ng, Ur ~~LOC~~: NOT DETECTED
Cocaine Metabolite,Ur ~~LOC~~: NOT DETECTED
MDMA (Ecstasy)Ur Screen: NOT DETECTED
Methadone Scn, Ur: NOT DETECTED
Opiate, Ur Screen: NOT DETECTED
Phencyclidine (PCP) Ur S: NOT DETECTED
Tricyclic, Ur Screen: NOT DETECTED

## 2018-07-25 LAB — MRSA PCR SCREENING: MRSA by PCR: NEGATIVE

## 2018-07-25 LAB — POTASSIUM: Potassium: 3.9 mmol/L (ref 3.5–5.1)

## 2018-07-25 LAB — TROPONIN I: Troponin I: <0.03 ng/mL (ref <0.03)

## 2018-07-25 LAB — LACTIC ACID, PLASMA
Lactic Acid, Venous: 2 mmol/L (ref 0.5–1.9)
Lactic Acid, Venous: 2.2 mmol/L (ref 0.5–1.9)

## 2018-07-25 LAB — LIPASE, BLOOD: Lipase: 23 U/L (ref 11–51)

## 2018-07-25 LAB — PROCALCITONIN: Procalcitonin: 0.39 ng/mL

## 2018-07-25 LAB — GLUCOSE, CAPILLARY: Glucose-Capillary: 75 mg/dL (ref 70–99)

## 2018-07-25 LAB — BRAIN NATRIURETIC PEPTIDE: B Natriuretic Peptide: 382 pg/mL — ABNORMAL HIGH (ref 0.0–100.0)

## 2018-07-25 LAB — MAGNESIUM: Magnesium: 1.8 mg/dL (ref 1.7–2.4)

## 2018-07-25 MED ORDER — ENOXAPARIN SODIUM 40 MG/0.4ML ~~LOC~~ SOLN
40.0000 mg | SUBCUTANEOUS | Status: DC
  Administered 2018-07-26: 40 mg via SUBCUTANEOUS
  Filled 2018-07-25: qty 0.4

## 2018-07-25 MED ORDER — POTASSIUM CHLORIDE 10 MEQ/100ML IV SOLN
10.0000 meq | INTRAVENOUS | Status: AC
  Administered 2018-07-25 (×4): 10 meq via INTRAVENOUS
  Filled 2018-07-25 (×4): qty 100

## 2018-07-25 MED ORDER — FAMOTIDINE IN NACL 20-0.9 MG/50ML-% IV SOLN
20.0000 mg | Freq: Two times a day (BID) | INTRAVENOUS | Status: DC
  Administered 2018-07-25 (×2): 20 mg via INTRAVENOUS
  Filled 2018-07-25 (×2): qty 50

## 2018-07-25 MED ORDER — SUCCINYLCHOLINE CHLORIDE 20 MG/ML IJ SOLN
100.0000 mg | Freq: Once | INTRAMUSCULAR | Status: AC | PRN
  Administered 2018-07-25: 02:00:00 100 mg via INTRAVENOUS
  Filled 2018-07-25: qty 1

## 2018-07-25 MED ORDER — POTASSIUM PHOSPHATES 15 MMOLE/5ML IV SOLN
30.0000 mmol | Freq: Once | INTRAVENOUS | Status: AC
  Administered 2018-07-25: 30 mmol via INTRAVENOUS
  Filled 2018-07-25: qty 10

## 2018-07-25 MED ORDER — PROPOFOL 1000 MG/100ML IV EMUL
5.0000 ug/kg/min | INTRAVENOUS | Status: DC
  Administered 2018-07-25: 50 ug/kg/min via INTRAVENOUS
  Administered 2018-07-25: 10 ug/kg/min via INTRAVENOUS
  Administered 2018-07-25: 50 ug/kg/min via INTRAVENOUS
  Filled 2018-07-25 (×2): qty 100

## 2018-07-25 MED ORDER — ORAL CARE MOUTH RINSE: 15.0000 mL | Freq: Two times a day (BID) | OROMUCOSAL | Status: DC

## 2018-07-25 MED ORDER — SODIUM CHLORIDE 0.9 % IV BOLUS
1000.0000 mL | Freq: Once | INTRAVENOUS | Status: AC
  Administered 2018-07-25: 1000 mL via INTRAVENOUS

## 2018-07-25 MED ORDER — SODIUM CHLORIDE 0.9 % IV BOLUS
1000.0000 mL | Freq: Once | INTRAVENOUS | Status: AC
  Administered 2018-07-25: 01:00:00 1000 mL via INTRAVENOUS

## 2018-07-25 MED ORDER — CHLORHEXIDINE GLUCONATE CLOTH 2 % EX PADS: 6.0000 | MEDICATED_PAD | Freq: Every day | CUTANEOUS | Status: DC

## 2018-07-25 MED ORDER — ENOXAPARIN SODIUM 40 MG/0.4ML ~~LOC~~ SOLN
40.0000 mg | SUBCUTANEOUS | Status: DC
  Administered 2018-07-25: 05:00:00 40 mg via SUBCUTANEOUS
  Filled 2018-07-25: qty 0.4

## 2018-07-25 MED ORDER — POTASSIUM CHLORIDE IN NACL 40-0.9 MEQ/L-% IV SOLN
INTRAVENOUS | Status: DC
  Administered 2018-07-25 – 2018-07-26 (×3): 125 mL/h via INTRAVENOUS
  Filled 2018-07-25 (×6): qty 1000

## 2018-07-25 MED ORDER — FENTANYL 2500MCG IN NS 250ML (10MCG/ML) PREMIX INFUSION
0.0000 ug/h | INTRAVENOUS | Status: DC
  Administered 2018-07-25: 150 ug/h via INTRAVENOUS
  Administered 2018-07-25: 400 ug/h via INTRAVENOUS
  Filled 2018-07-25: qty 250

## 2018-07-25 MED ORDER — KETAMINE HCL 10 MG/ML IJ SOLN
100.0000 mg | Freq: Once | INTRAMUSCULAR | Status: AC
  Administered 2018-07-25: 100 mg via INTRAVENOUS

## 2018-07-25 MED ORDER — FENTANYL CITRATE (PF) 100 MCG/2ML IJ SOLN: 25.0000 ug | INTRAMUSCULAR | Status: DC | PRN

## 2018-07-25 MED ORDER — SUCCINYLCHOLINE CHLORIDE 20 MG/ML IJ SOLN
100.0000 mg | Freq: Once | INTRAMUSCULAR | Status: AC
  Administered 2018-07-25: 100 mg via INTRAVENOUS

## 2018-07-25 MED ORDER — CHLORHEXIDINE GLUCONATE 0.12% ORAL RINSE (MEDLINE KIT)
15.0000 mL | Freq: Two times a day (BID) | OROMUCOSAL | Status: DC
  Administered 2018-07-25 (×2): 15 mL via OROMUCOSAL

## 2018-07-25 MED ORDER — ONDANSETRON HCL 4 MG/2ML IJ SOLN: 4.0000 mg | Freq: Four times a day (QID) | INTRAMUSCULAR | Status: DC | PRN

## 2018-07-25 MED ORDER — SODIUM CHLORIDE 0.9 % IV SOLN
1.0000 mg/kg/h | INTRAVENOUS | Status: DC
  Administered 2018-07-25: 1 mg/kg/h via INTRAVENOUS
  Filled 2018-07-25: qty 5

## 2018-07-25 MED ORDER — KETAMINE HCL 10 MG/ML IJ SOLN
INTRAMUSCULAR | Status: AC
  Filled 2018-07-25: qty 1

## 2018-07-25 MED ORDER — SODIUM CHLORIDE 0.9 % IV BOLUS
500.0000 mL | Freq: Once | INTRAVENOUS | Status: AC
  Administered 2018-07-25: 500 mL via INTRAVENOUS

## 2018-07-25 MED ORDER — ACETAMINOPHEN 325 MG PO TABS
650.0000 mg | ORAL_TABLET | Freq: Four times a day (QID) | ORAL | Status: DC | PRN
  Administered 2018-07-25 – 2018-07-26 (×2): 650 mg via ORAL
  Filled 2018-07-25 (×2): qty 2

## 2018-07-25 MED ORDER — ACETAMINOPHEN 650 MG RE SUPP: 650.0000 mg | Freq: Four times a day (QID) | RECTAL | Status: DC | PRN

## 2018-07-25 MED ORDER — ORAL CARE MOUTH RINSE
15.0000 mL | OROMUCOSAL | Status: DC
  Administered 2018-07-25: 15 mL via OROMUCOSAL

## 2018-07-25 MED ORDER — SUCCINYLCHOLINE CHLORIDE 20 MG/ML IJ SOLN
120.0000 mg | Freq: Once | INTRAMUSCULAR | Status: AC
  Administered 2018-07-25: 120 mg via INTRAVENOUS

## 2018-07-25 MED ORDER — MAGNESIUM SULFATE 2 GM/50ML IV SOLN
2.0000 g | Freq: Once | INTRAVENOUS | Status: AC
  Administered 2018-07-25: 2 g via INTRAVENOUS
  Filled 2018-07-25: qty 50

## 2018-07-25 MED ORDER — DOCUSATE SODIUM 100 MG PO CAPS: 100.0000 mg | ORAL_CAPSULE | Freq: Two times a day (BID) | ORAL | Status: DC

## 2018-07-25 MED ORDER — SUCCINYLCHOLINE CHLORIDE 20 MG/ML IJ SOLN: 120.0000 mg | Freq: Once | INTRAMUSCULAR | Status: DC | PRN

## 2018-07-25 MED ORDER — MIDAZOLAM HCL 2 MG/2ML IJ SOLN
2.0000 mg | INTRAMUSCULAR | Status: DC | PRN
  Administered 2018-07-25: 2 mg via INTRAVENOUS
  Filled 2018-07-25: qty 2

## 2018-07-25 MED ORDER — DEXMEDETOMIDINE HCL IN NACL 400 MCG/100ML IV SOLN
0.4000 ug/kg/h | INTRAVENOUS | Status: DC
  Administered 2018-07-25: 1 ug/kg/h via INTRAVENOUS
  Administered 2018-07-25: 0.6 ug/kg/h via INTRAVENOUS
  Filled 2018-07-25 (×2): qty 100

## 2018-07-25 MED ORDER — ETOMIDATE 2 MG/ML IV SOLN
20.0000 mg | Freq: Once | INTRAVENOUS | Status: AC
  Administered 2018-07-25: 20 mg via INTRAVENOUS

## 2018-07-25 MED ORDER — ONDANSETRON HCL 4 MG PO TABS: 4.0000 mg | ORAL_TABLET | Freq: Four times a day (QID) | ORAL | Status: DC | PRN

## 2018-07-25 MED ORDER — MIDAZOLAM HCL 2 MG/2ML IJ SOLN
INTRAMUSCULAR | Status: AC
  Administered 2018-07-25: 2 mg via INTRAVENOUS
  Filled 2018-07-25: qty 2

## 2018-07-25 MED ORDER — MIDAZOLAM HCL 2 MG/2ML IJ SOLN
2.0000 mg | Freq: Once | INTRAMUSCULAR | Status: AC
  Administered 2018-07-25: 2 mg via INTRAVENOUS

## 2018-07-25 NOTE — ED Triage Notes (Signed)
EMS stated that on arrived pt was being bagged by fire. They stated that pt came to while at home but on the way to the ED, pt arrested again. Pt was being bagged on arrive to the ED. PT was seen in the ED last night and left AMA.

## 2018-07-25 NOTE — H&P (Signed)
Leonard Rogers is an 63 y.o. male.   Chief Complaint: Chest pain HPI: The patient with past medical history of COPD, depression and Raynaud disease presents to the emergency department via EMS after becoming unresponsive.  The patient reportedly complained to his wife about chest pain prior to losing consciousness.  Notably the patient was seen in the emergency department last night for respiratory failure with hypercapnia but left AGAINST MEDICAL ADVICE.  Upon arrival to the emergency department tonight the patient was intubated.  Urine toxicology screen was positive for amphetamines and benzodiazepines, the latter of which have been implicated and the patient's respiratory failure in the past.  Once patient was stabilized emergency department staff call the hospitalist service for admission.  Past Medical History:  Diagnosis Date  . Anxiety   . Arthritis   . Chronic pain   . Depression   . Difficult intubation   . Hypertension   . Raynaud disease     Past Surgical History:  Procedure Laterality Date  . APPENDECTOMY    . ESOPHAGOGASTRODUODENOSCOPY (EGD) WITH PROPOFOL N/A 03/17/2017   Procedure: ESOPHAGOGASTRODUODENOSCOPY (EGD) WITH PROPOFOL, removal of foreign body;  Surgeon: Toledo, Boykin Nearing, MD;  Location: [facility] ENDOSCOPY;  Service: Gastroenterology;  Laterality: N/A;    Family History  Problem Relation Age of Onset  . CAD Mother   . Cirrhosis Father   . CAD Brother    Social History:  reports that he has been smoking. He has been smoking about 1.00 pack per day. He has never used smokeless tobacco. He reports current alcohol use of about 7.0 standard drinks of alcohol per week. He reports that he does not use drugs.  Allergies:  Allergies  Allergen Reactions  . Ace Inhibitors Anaphylaxis and Other (See Comments)  . Vistaril [Hydroxyzine Hcl]     Unknown per pt    Medications Prior to Admission  Medication Sig Dispense Refill  . amLODipine (NORVASC) 10 MG tablet Take 10  mg by mouth daily.    . hydrochlorothiazide (HYDRODIURIL) 25 MG tablet Take 25 mg by mouth daily.    . metoprolol tartrate (LOPRESSOR) 25 MG tablet Take 25 mg by mouth 2 (two) times daily.    Marland Kitchen oxyCODONE-acetaminophen (PERCOCET) 10-325 MG tablet Take 1 tablet by mouth 4 (four) times daily.  0    Results for orders placed or performed during the hospital encounter of 07/25/18 (from the past 48 hour(s))  CBC with Differential     Status: Abnormal   Collection Time: 07/25/18 12:17 AM  Result Value Ref Range   WBC 9.7 4.0 - 10.5 K/uL   RBC 4.33 4.22 - 5.81 MIL/uL   Hemoglobin 12.5 (L) 13.0 - 17.0 g/dL   HCT 11.9 14.7 - 82.9 %   MCV 91.0 80.0 - 100.0 fL   MCH 28.9 26.0 - 34.0 pg   MCHC 31.7 30.0 - 36.0 g/dL   RDW 56.2 13.0 - 86.5 %   Platelets 274 150 - 400 K/uL   nRBC 0.0 0.0 - 0.2 %   Neutrophils Relative % 67 %   Neutro Abs 6.4 1.7 - 7.7 K/uL   Lymphocytes Relative 24 %   Lymphs Abs 2.3 0.7 - 4.0 K/uL   Monocytes Relative 9 %   Monocytes Absolute 0.9 0.1 - 1.0 K/uL   Eosinophils Relative 0 %   Eosinophils Absolute 0.0 0.0 - 0.5 K/uL   Basophils Relative 0 %   Basophils Absolute 0.0 0.0 - 0.1 K/uL   Immature Granulocytes 0 %  Abs Immature Granulocytes 0.04 0.00 - 0.07 K/uL    Comment: Performed at [facility], [address] [place], [place] [address]  Brain natriuretic peptide     Status: Abnormal   Collection Time: 07/25/18 12:17 AM  Result Value Ref Range   B Natriuretic Peptide 382.0 (H) 0.0 - 100.0 pg/mL    Comment: Performed at [facility], [address] [place], [place] [address]  Blood gas, arterial     Status: Abnormal   Collection Time: 07/25/18 12:17 AM  Result Value Ref Range   FIO2 1.00    Delivery systems VENTILATOR    Mode ASSIST CONTROL    VT 500.0 mL   Peep/cpap 5.0 cm H20   pH, Arterial 7.31 (L) 7.350 - 7.450   pCO2 arterial 57 (H) 32.0 - 48.0 mmHg   pO2, Arterial 477 (H) 83.0 - 108.0 mmHg   Bicarbonate 28.7 (H) 20.0 - 28.0  mmol/L   Acid-Base Excess 1.2 0.0 - 2.0 mmol/L   O2 Saturation 100.0 %   Patient temperature 37.0    Collection site REVIEWED BY    Sample type ARTERIAL DRAW    Mechanical Rate 20     Comment: Performed at [facility], [address] [place], [place] [address]  Comprehensive metabolic panel     Status: Abnormal   Collection Time: 07/25/18 12:17 AM  Result Value Ref Range   Sodium 136 135 - 145 mmol/L   Potassium 2.8 (L) 3.5 - 5.1 mmol/L   Chloride 95 (L) 98 - 111 mmol/L   CO2 28 22 - 32 mmol/L   Glucose, Bld 129 (H) 70 - 99 mg/dL   BUN 14 6 - 20 mg/dL   Creatinine, Ser 9.14 0.61 - 1.24 mg/dL   Calcium 8.6 (L) 8.9 - 10.3 mg/dL   Total Protein 7.3 6.5 - 8.1 g/dL   Albumin 3.8 3.5 - 5.0 g/dL   AST 72 (H) 15 - 41 U/L   ALT 57 (H) 0 - 44 U/L   Alkaline Phosphatase 87 38 - 126 U/L   Total Bilirubin 0.8 0.3 - 1.2 mg/dL   GFR calc non Af Amer >60 >60 mL/min   GFR calc Af Amer >60 >60 mL/min   Anion gap 13 5 - 15    Comment: Performed at [facility], [address] [place], [place] [address]  Lipase, blood     Status: None   Collection Time: 07/25/18 12:17 AM  Result Value Ref Range   Lipase 23 11 - 51 U/L    Comment: Performed at [facility], [address] [place], [place] [address]  Troponin I - ONCE - STAT     Status: None   Collection Time: 07/25/18 12:17 AM  Result Value Ref Range   Troponin I <0.03 <0.03 ng/mL    Comment: Performed at [facility], [address] [place], [place] [address]  Lactic acid, plasma     Status: Abnormal   Collection Time: 07/25/18 12:19 AM  Result Value Ref Range   Lactic Acid, Venous 2.0 (HH) 0.5 - 1.9 mmol/L    Comment: CRITICAL RESULT CALLED TO, READ BACK BY AND VERIFIED WITH BRITTANY SAMPSON AT 0119 ON 07/25/2018 [facility]. Performed at [facility], [address] [place], [place] [address]   Urinalysis, Routine w reflex microscopic     Status: Abnormal   Collection Time: 07/25/18 12:58 AM   Result Value Ref Range   Color, Urine YELLOW (A) YELLOW  APPearance CLOUDY (A) CLEAR   Specific Gravity, Urine 1.017 1.005 - 1.030   pH 5.0 5.0 - 8.0   Glucose, UA NEGATIVE NEGATIVE mg/dL   Hgb urine dipstick NEGATIVE NEGATIVE   Bilirubin Urine NEGATIVE NEGATIVE   Ketones, ur 80 (A) NEGATIVE mg/dL   Protein, ur [address] (A) NEGATIVE mg/dL   Nitrite NEGATIVE NEGATIVE   Leukocytes,Ua NEGATIVE NEGATIVE    Comment: Performed at [facility], [address] [place], [place] [address]  Urine Drug Screen, Qualitative ([facility] only)     Status: Abnormal   Collection Time: 07/25/18 12:58 AM  Result Value Ref Range   Tricyclic, Ur Screen NONE DETECTED NONE DETECTED   Amphetamines, Ur Screen POSITIVE (A) NONE DETECTED   MDMA (Ecstasy)Ur Screen NONE DETECTED NONE DETECTED   Cocaine Metabolite,Ur [place] NONE DETECTED NONE DETECTED   Opiate, Ur Screen NONE DETECTED NONE DETECTED   Phencyclidine (PCP) Ur S NONE DETECTED NONE DETECTED   Cannabinoid 50 Ng, Ur [place] NONE DETECTED NONE DETECTED   Barbiturates, Ur Screen NONE DETECTED NONE DETECTED   Benzodiazepine, Ur Scrn POSITIVE (A) NONE DETECTED   Methadone Scn, Ur NONE DETECTED NONE DETECTED    Comment: (NOTE) Tricyclics + metabolites, urine    Cutoff 1000 ng/mL Amphetamines + metabolites, urine  Cutoff 1000 ng/mL MDMA (Ecstasy), urine              Cutoff 500 ng/mL Cocaine Metabolite, urine          Cutoff 300 ng/mL Opiate + metabolites, urine        Cutoff 300 ng/mL Phencyclidine (PCP), urine         Cutoff 25 ng/mL Cannabinoid, urine                 Cutoff 50 ng/mL Barbiturates + metabolites, urine  Cutoff 200 ng/mL Benzodiazepine, urine              Cutoff 200 ng/mL Methadone, urine                   Cutoff 300 ng/mL The urine drug screen provides only a preliminary, unconfirmed analytical test result and should not be used for non-medical purposes. Clinical consideration and professional judgment should be applied to any positive drug  screen result due to possible interfering substances. A more specific alternate chemical method must be used in order to obtain a confirmed analytical result. Gas chromatography / mass spectrometry (GC/MS) is the preferred confirmat ory method. Performed at [facility], [address] [place], [place] [address]   MRSA PCR Screening     Status: None   Collection Time: 07/25/18  4:36 AM  Result Value Ref Range   MRSA by PCR NEGATIVE NEGATIVE    Comment:        The GeneXpert MRSA Assay (FDA approved for NASAL specimens only), is one component of a comprehensive MRSA colonization surveillance program. It is not intended to diagnose MRSA infection nor to guide or monitor treatment for MRSA infections. Performed at [facility], [address] [place], [place] [address]   Glucose, capillary     Status: None   Collection Time: 07/25/18  4:41 AM  Result Value Ref Range   Glucose-Capillary 75 70 - 99 mg/dL  Lactic acid, plasma     Status: Abnormal   Collection Time: 07/25/18  5:44 AM  Result Value Ref Range   Lactic Acid, Venous 2.2 (HH) 0.5 - 1.9 mmol/L    Comment: CRITICAL RESULT CALLED TO,  READ BACK BY AND VERIFIED WITH RENEE BABB AT 0625 ON 07/25/2018 [facility]. Performed at [facility], [address] [place], [place] [address]    Ct Head Wo Contrast  Result Date: 07/25/2018 CLINICAL DATA:  Respiratory arrest, seizure like activity EXAM: CT HEAD WITHOUT CONTRAST CT CERVICAL SPINE WITHOUT CONTRAST TECHNIQUE: Multidetector CT imaging of the head and cervical spine was performed following the standard protocol without intravenous contrast. Multiplanar CT image reconstructions of the cervical spine were also generated. COMPARISON:  CT cervical spine 08/04/2010 FINDINGS: CT HEAD FINDINGS Brain: No acute intracranial abnormality. Specifically, no hemorrhage, hydrocephalus, mass lesion, acute infarction, or significant intracranial injury. Vascular: No  hyperdense vessel or unexpected calcification. Skull: No acute calvarial abnormality. Sinuses/Orbits: No acute findings Other: None CT CERVICAL SPINE FINDINGS Alignment: No subluxation.  Mild leftward scoliosis. Skull base and vertebrae: No acute fracture. No primary bone lesion or focal pathologic process. Soft tissues and spinal canal: No prevertebral fluid or swelling. No visible canal hematoma. Disc levels: Diffuse degenerative disc and facet disease throughout the cervical spine. Upper chest: No acute findings Other: Endotracheal tube and NG tube in place. IMPRESSION: No acute intracranial abnormality. 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 No acute bony abnormality. Electronically Signed   By: Charlett Nose M.D.   On: 07/25/2018 02:16   Ct Angio Chest Pe W And/or Wo Contrast  Result Date: 07/24/2018 CLINICAL DATA:  Shortness of breath EXAM: CT ANGIOGRAPHY CHEST WITH CONTRAST TECHNIQUE: Multidetector CT imaging of the chest was performed using the standard protocol during bolus administration of intravenous contrast. Multiplanar CT image reconstructions and MIPs were obtained to evaluate the vascular anatomy. CONTRAST:  47mL OMNIPAQUE IOHEXOL 350 MG/ML SOLN COMPARISON:  Chest x-ray 07/24/2018 FINDINGS: Cardiovascular: No filling defects in the pulmonary arteries to suggest pulmonary emboli. Coronary artery calcifications in the left anterior descending coronary artery. Scattered aortic calcifications. Heart is normal size. Aorta is normal caliber. Mediastinum/Nodes: Mildly prominent right hilar lymph nodes with a short axis diameter of 13 mm. Left hilar lymph node has a short axis diameter of 9 mm. Subcarinal lymph node has a short axis diameter of 11 mm. No axillary adenopathy. Lungs/Pleura: Postoperative changes in the right hemithorax. Predominantly linear densities in the right lower lobe likely reflects scarring. There is a small gas collection peripherally in the area of  scarring which could be within the pleural space. Linear scarring or atelectasis at the left base. No effusions. Upper Abdomen: Imaging into the upper abdomen shows no acute findings. Musculoskeletal: Chest wall soft tissues are unremarkable. No acute bony abnormality. Prior resection of the posterior right 7th and 8th ribs. Review of the MIP images confirms the above findings. IMPRESSION: Postoperative changes on the right with linear peripheral densities in the right lower lobe, likely scarring. There is a small rounded gas collection peripherally in the area of scarring which could be within the pleural space and represent a small loculated pneumothorax. This is likely not clinically significant. Mildly prominent bilateral hilar and mediastinal lymph nodes, likely reactive. Coronary artery disease. No evidence of pulmonary embolus. Aortic Atherosclerosis (ICD10-I70.0). Electronically Signed   By: Charlett Nose M.D.   On: 07/24/2018 03:56   Ct Cervical Spine Wo Contrast  Result Date: 07/25/2018 CLINICAL DATA:  Respiratory arrest, seizure like activity EXAM: CT HEAD WITHOUT CONTRAST CT CERVICAL SPINE WITHOUT CONTRAST TECHNIQUE: Multidetector CT imaging of the head and cervical spine was performed following the standard protocol without intravenous contrast. Multiplanar CT image reconstructions of the  cervical spine were also generated. COMPARISON:  CT cervical spine 08/04/2010 FINDINGS: CT HEAD FINDINGS Brain: No acute intracranial abnormality. Specifically, no hemorrhage, hydrocephalus, mass lesion, acute infarction, or significant intracranial injury. Vascular: No hyperdense vessel or unexpected calcification. Skull: No acute calvarial abnormality. Sinuses/Orbits: No acute findings Other: None CT CERVICAL SPINE FINDINGS Alignment: No subluxation.  Mild leftward scoliosis. Skull base and vertebrae: No acute fracture. No primary bone lesion or focal pathologic process. Soft tissues and spinal canal: No  prevertebral fluid or swelling. No visible canal hematoma. Disc levels: Diffuse degenerative disc and facet disease throughout the cervical spine. Upper chest: No acute findings Other: Endotracheal tube and NG tube in place. IMPRESSION: No acute intracranial abnormality. 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 No acute bony abnormality. Electronically Signed   By: Charlett Nose M.D.   On: 07/25/2018 02:16   Dg Chest Portable 1 View  Result Date: 07/25/2018 CLINICAL DATA:  Post intubation EXAM: PORTABLE CHEST 1 VIEW COMPARISON:  Chest x-ray and chest CT 07/24/2018 FINDINGS: Endotracheal tube is 4 cm above the carina. NG tube enters the stomach. Heart is normal size. Patchy opacities in the right lower lung, as seen on prior study, likely scarring. No definite acute confluent opacity or effusion. IMPRESSION: Endotracheal tube 4 cm above the carina. NG tube enters the stomach. Otherwise no change. Electronically Signed   By: Charlett Nose M.D.   On: 07/25/2018 01:06   Dg Chest Portable 1 View  Result Date: 07/24/2018 CLINICAL DATA:  Hypoxemia EXAM: PORTABLE CHEST 1 VIEW COMPARISON:  09/04/2017 FINDINGS: Heart is normal size. There are multiple right rib fractures and apparent posterior right rib resection. Predominantly linear densities in the right mid and lower lung could reflect postoperative scarring. No confluent opacity on the left. No effusions or acute bony abnormality. IMPRESSION: Interval postoperative changes on the right since prior study with posterior right rib resection. Probable scarring in the right mid and lower lung. No acute findings. Electronically Signed   By: Charlett Nose M.D.   On: 07/24/2018 01:54    Review of Systems  Unable to perform ROS: Intubated    Blood pressure (!) 100/56, pulse 68, temperature 99 F (37.2 C), temperature source Oral, resp. rate 20, weight 66.1 kg, SpO2 100 %. Physical Exam  Vitals reviewed. Constitutional: He appears  well-developed and well-nourished. No distress. He is intubated.  HENT:  Head: Normocephalic and atraumatic.  Mouth/Throat: Oropharynx is clear and moist.  Eyes: Pupils are equal, round, and reactive to light. Conjunctivae and EOM are normal. No scleral icterus.  Neck: Normal range of motion. Neck supple. No JVD present. No tracheal deviation present. No thyromegaly present.  Cardiovascular: Normal rate, regular rhythm and normal heart sounds. Exam reveals no gallop and no friction rub.  No murmur heard. Respiratory: Breath sounds normal. He is intubated. No respiratory distress.  GI: Soft. Bowel sounds are normal. He exhibits no distension. There is no abdominal tenderness.  Genitourinary:    Genitourinary Comments: Deferred   Musculoskeletal: Normal range of motion.        General: No edema.  Lymphadenopathy:    He has no cervical adenopathy.  Neurological: No cranial nerve deficit.  Sedated and intubated  Skin: Skin is warm and dry. No rash noted. No erythema.  Psychiatric:  Patient is sedated and intubated     Assessment/Plan This is a 63 year old male admitted for respiratory failure. 1.  Respiratory failure: Acute on chronic; with hypoxia and hypercapnia.  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  Continue mechanical ventilation.  Supplemental oxygen as needed. 2.  Benzodiazepine overdose: We will not need dialysis but consider flumazenil if difficulty weaning from ventilator 3.  COPD: Continue inhaled corticosteroid and albuterol as needed through vent circuit 4.  Hypokalemia: Replete potassium 5.  DVT prophylaxis: Lovenox 6.  GI prophylaxis: PPI after 24 hours intubation The patient is a full code.  I have personally spent 45 minutes in critical care time with this patient.  [facility] telemedicine notified.  Arnaldo Natal, MD 07/25/2018, 6:34 AM

## 2018-07-25 NOTE — Progress Notes (Signed)
Extubated without complications to 2lnc 

## 2018-07-25 NOTE — Progress Notes (Signed)
Pt transported ICU WITHOUT ANY INCIDENT ,REPORT GIVEN TO ICU RT IN CHARGE OF THE PT.

## 2018-07-25 NOTE — Progress Notes (Signed)
[facility] - [place] at [facility]   PATIENT NAME: Leonard Rogers    MR#:  [id number]  DATE OF BIRTH:  [birth year]  SUBJECTIVE:  CHIEF COMPLAINT:   Chief Complaint  Patient presents with  . Respiratory Arrest  Patient seen and evaluated today Currently on ventilator [place] volume 500 PEEP 5 FiO2 40%  REVIEW OF SYSTEMS:    ROS  Patient critically ill on ventilator  DRUG ALLERGIES:   Allergies  Allergen Reactions  . Ace Inhibitors Anaphylaxis and Other (See Comments)  . Vistaril [Hydroxyzine Hcl]     Unknown per pt    VITALS:  Blood pressure (!) 100/55, pulse 72, temperature 98.3 F (36.8 C), temperature source Axillary, resp. rate 20, weight 66.1 kg, SpO2 100 %.  PHYSICAL EXAMINATION:   Physical Exam  GENERAL:  63 y.o.-year-old patient lying in the bed on ventilator EYES: Pupils equal, round, reactive to light and accommodation. No scleral icterus. Extraocular muscles intact.  HEENT: Head atraumatic, normocephalic. Oropharynx endotracheal tube noted NECK:  Supple, no jugular venous distention. No thyroid enlargement, no tenderness.  LUNGS: Decreased breath sounds bilaterally, rales heard.  CARDIOVASCULAR: S1, S2 normal. No murmurs, rubs, or gallops.  ABDOMEN: Soft, nontender, nondistended. Bowel sounds present. No organomegaly or mass.  EXTREMITIES: No cyanosis, clubbing or edema b/l.    NEUROLOGIC: On ventilator Could not be assessed PSYCHIATRIC: The patient is alert and oriented x none SKIN: No obvious rash, lesion, or ulcer.   LABORATORY PANEL:   CBC Recent Labs  Lab 07/25/18 0017  WBC 9.7  HGB 12.5*  HCT 39.4  PLT 274   ------------------------------------------------------------------------------------------------------------------ Chemistries  Recent Labs  Lab 07/24/18 0208 07/25/18 0017  NA 136 136  K 3.5 2.8*  CL 94* 95*  CO2 34* 28  GLUCOSE 157* 129*  BUN 19 14  CREATININE 0.78 0.81  CALCIUM 8.8* 8.6*  MG 2.2  --    AST 16 72*  ALT 26 57*  ALKPHOS 68 87  BILITOT 0.2* 0.8   ------------------------------------------------------------------------------------------------------------------  Cardiac Enzymes Recent Labs  Lab 07/25/18 0017  TROPONINI <0.03   ------------------------------------------------------------------------------------------------------------------  RADIOLOGY:  Ct Head Wo Contrast  Result Date: 07/25/2018 CLINICAL DATA:  Respiratory arrest, seizure like activity EXAM: CT HEAD WITHOUT CONTRAST CT CERVICAL SPINE WITHOUT CONTRAST TECHNIQUE: Multidetector CT imaging of the head and cervical spine was performed following the standard protocol without intravenous contrast. Multiplanar CT image reconstructions of the cervical spine were also generated. COMPARISON:  CT cervical spine 08/04/2010 FINDINGS: CT HEAD FINDINGS Brain: No acute intracranial abnormality. Specifically, no hemorrhage, hydrocephalus, mass lesion, acute infarction, or significant intracranial injury. Vascular: No hyperdense vessel or unexpected calcification. Skull: No acute calvarial abnormality. Sinuses/Orbits: No acute findings Other: None CT CERVICAL SPINE FINDINGS Alignment: No subluxation.  Mild leftward scoliosis. Skull base and vertebrae: No acute fracture. No primary bone lesion or focal pathologic process. Soft tissues and spinal canal: No prevertebral fluid or swelling. No visible canal hematoma. Disc levels: Diffuse degenerative disc and facet disease throughout the cervical spine. Upper chest: No acute findings Other: Endotracheal tube and NG tube in place. IMPRESSION: No acute intracranial abnormality. 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 No acute bony abnormality. Electronically Signed   By: Charlett Nose M.D.   On: 07/25/2018 02:16   Ct Angio Chest Pe W And/or Wo Contrast  Result Date: 07/24/2018 CLINICAL DATA:  Shortness of breath EXAM: CT ANGIOGRAPHY CHEST WITH CONTRAST TECHNIQUE:  Multidetector CT imaging of the chest was performed using the  standard protocol during bolus administration of intravenous contrast. Multiplanar CT image reconstructions and MIPs were obtained to evaluate the vascular anatomy. CONTRAST:  75mL OMNIPAQUE IOHEXOL 350 MG/ML SOLN COMPARISON:  Chest x-ray 07/24/2018 FINDINGS: Cardiovascular: No filling defects in the pulmonary arteries to suggest pulmonary emboli. Coronary artery calcifications in the left anterior descending coronary artery. Scattered aortic calcifications. Heart is normal size. Aorta is normal caliber. Mediastinum/Nodes: Mildly prominent right hilar lymph nodes with a short axis diameter of 13 mm. Left hilar lymph node has a short axis diameter of 9 mm. Subcarinal lymph node has a short axis diameter of 11 mm. No axillary adenopathy. Lungs/Pleura: Postoperative changes in the right hemithorax. Predominantly linear densities in the right lower lobe likely reflects scarring. There is a small gas collection peripherally in the area of scarring which could be within the pleural space. Linear scarring or atelectasis at the left base. No effusions. Upper Abdomen: Imaging into the upper abdomen shows no acute findings. Musculoskeletal: Chest wall soft tissues are unremarkable. No acute bony abnormality. Prior resection of the posterior right 7th and 8th ribs. Review of the MIP images confirms the above findings. IMPRESSION: Postoperative changes on the right with linear peripheral densities in the right lower lobe, likely scarring. There is a small rounded gas collection peripherally in the area of scarring which could be within the pleural space and represent a small loculated pneumothorax. This is likely not clinically significant. Mildly prominent bilateral hilar and mediastinal lymph nodes, likely reactive. Coronary artery disease. No evidence of pulmonary embolus. Aortic Atherosclerosis (ICD10-I70.0). Electronically Signed   By: Charlett Nose M.D.   On:  07/24/2018 03:56   Ct Cervical Spine Wo Contrast  Result Date: 07/25/2018 CLINICAL DATA:  Respiratory arrest, seizure like activity EXAM: CT HEAD WITHOUT CONTRAST CT CERVICAL SPINE WITHOUT CONTRAST TECHNIQUE: Multidetector CT imaging of the head and cervical spine was performed following the standard protocol without intravenous contrast. Multiplanar CT image reconstructions of the cervical spine were also generated. COMPARISON:  CT cervical spine 08/04/2010 FINDINGS: CT HEAD FINDINGS Brain: No acute intracranial abnormality. Specifically, no hemorrhage, hydrocephalus, mass lesion, acute infarction, or significant intracranial injury. Vascular: No hyperdense vessel or unexpected calcification. Skull: No acute calvarial abnormality. Sinuses/Orbits: No acute findings Other: None CT CERVICAL SPINE FINDINGS Alignment: No subluxation.  Mild leftward scoliosis. Skull base and vertebrae: No acute fracture. No primary bone lesion or focal pathologic process. Soft tissues and spinal canal: No prevertebral fluid or swelling. No visible canal hematoma. Disc levels: Diffuse degenerative disc and facet disease throughout the cervical spine. Upper chest: No acute findings Other: Endotracheal tube and NG tube in place. IMPRESSION: No acute intracranial abnormality. 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 No acute bony abnormality. Electronically Signed   By: Charlett Nose M.D.   On: 07/25/2018 02:16   Dg Chest Portable 1 View  Result Date: 07/25/2018 CLINICAL DATA:  Post intubation EXAM: PORTABLE CHEST 1 VIEW COMPARISON:  Chest x-ray and chest CT 07/24/2018 FINDINGS: Endotracheal tube is 4 cm above the carina. NG tube enters the stomach. Heart is normal size. Patchy opacities in the right lower lung, as seen on prior study, likely scarring. No definite acute confluent opacity or effusion. IMPRESSION: Endotracheal tube 4 cm above the carina. NG tube enters the stomach. Otherwise no change.  Electronically Signed   By: Charlett Nose M.D.   On: 07/25/2018 01:06   Dg Chest Portable 1 View  Result Date: 07/24/2018 CLINICAL DATA:  Hypoxemia EXAM: PORTABLE CHEST  1 VIEW COMPARISON:  09/04/2017 FINDINGS: Heart is normal size. There are multiple right rib fractures and apparent posterior right rib resection. Predominantly linear densities in the right mid and lower lung could reflect postoperative scarring. No confluent opacity on the left. No effusions or acute bony abnormality. IMPRESSION: Interval postoperative changes on the right since prior study with posterior right rib resection. Probable scarring in the right mid and lower lung. No acute findings. Electronically Signed   By: Charlett NoseKevin  Dover M.D.   On: 07/24/2018 01:54     ASSESSMENT AND PLAN:  63 year old male patient with history of COPD, ray nods disease, hypertension currently in the ICU for respiratory failure  -Acute respiratory failure Hypoxia with hypercapnia Secondary to COPD exacerbation and benzodiazepine abuse On mechanical ventilator Continue vent bundle Intensivist follow-up  -Emphysema Continue inhaled steroids and albuterol nebulizations  -Acute hypokalemia Replace potassium aggressively  -Benzodiazepine overdose Dialysis not needed currently  -DVT prophylaxis subcu Lovenox daily   All the records are reviewed and case discussed with Care Management/Social Worker. Management plans discussed with the patient, family and they are in agreement.  CODE STATUS: Full code  DVT Prophylaxis: SCDs  TOTAL TIME TAKING CARE OF THIS PATIENT: 52 minutes.   POSSIBLE D/C IN 3 to 4 DAYS, DEPENDING ON CLINICAL CONDITION.  Ihor AustinPavan Pyreddy M.D on 07/25/2018 at 10:07 AM  Between 7am to 6pm - Pager - [phone]  After 6pm go to [email or web address] - password [email or web address]  [facility]  Office  [phone]  CC: Primary care physician; System, Pcp Not In  Note: This dictation was prepared with Dragon dictation  along with smaller phrase technology. Any transcriptional errors that result from this process are unintentional.

## 2018-07-25 NOTE — Progress Notes (Signed)
Pharmacy Electrolyte Monitoring Consult:  Pharmacy consulted to assist in monitoring and replacing electrolytes in this 63 y.o. male admitted on 07/25/2018 with Respiratory Arrest   Labs:  Sodium (mmol/L)  Date Value  07/25/2018 140   Potassium (mmol/L)  Date Value  07/25/2018 3.1 (L)   Magnesium (mg/dL)  Date Value  37/06/8887 1.8   Phosphorus (mg/dL)  Date Value  16/94/5038 <1.0 (LL)   Calcium (mg/dL)  Date Value  88/28/0034 8.1 (L)   Albumin (g/dL)  Date Value  91/79/1505 3.8    Assessment/Plan: Patient receiving NS/Potassium at 151mL/hr.   Potassium phosphate IV x1. Magnesium 2g IV x 1.   Check potassium and phosphorus at 2000. Will obtain all electrolytes with am labs.   Will replace for goal potassium ~4, goal magnesium ~ 2, and goal phosphorus > 2.5.   Pharmacy will continue to monitor and adjust per consult.    Emidio Warrell L 07/25/2018 4:01 PM

## 2018-07-25 NOTE — Progress Notes (Signed)
CRITICAL VALUE ALERT  Critical Value: 2.2  Date & Time Notied: 07/25/2018 6:30  Provider Notified: Harlon Ditty, NP  Orders Received/Actions taken: 500cc fluid bolus

## 2018-07-25 NOTE — ED Provider Notes (Signed)
[facility] Emergency Department Provider Note  ____________________________________________   First MD Initiated Contact with Patient 07/25/18 0015     (approximate)  I have reviewed the triage vital signs and the nursing notes.   HISTORY  Chief Complaint Respiratory Arrest  Level 5 caveat:  history/ROS limited by acute/critical illness  HPI Leonard Rogers is a 63 y.o. male with medical history as listed below which notably includes chronic pain, opioid and benzodiazepine use, and what appears to likely be COPD.  I saw him in this emergency department last night and diagnosed him with acute respiratory failure with hypercapnia but he left AGAINST MEDICAL ADVICE after we had a full discussion of the risks of him going home with his poor ventilation (which could be secondary to underlying lung disease as well as overuse of benzodiazepines).  He presents tonight by EMS for respiratory failure.  Minimal details are available, but apparently he became unresponsive at home and was not breathing.   The patient reportedly had some seizure-like activity at some point during the respiratory arrest.  He was being bagged by the fire department and paramedics upon arrival to the ED and he was unresponsive.        Past Medical History:  Diagnosis Date  . Anxiety   . Arthritis   . Chronic pain   . Depression   . Difficult intubation   . Hypertension   . Raynaud disease     Patient Active Problem List   Diagnosis Date Noted  . Chest pain 02/15/2016    Past Surgical History:  Procedure Laterality Date  . APPENDECTOMY    . ESOPHAGOGASTRODUODENOSCOPY (EGD) WITH PROPOFOL N/A 03/17/2017   Procedure: ESOPHAGOGASTRODUODENOSCOPY (EGD) WITH PROPOFOL, removal of foreign body;  Surgeon: Toledo, Boykin Nearingeodoro K, MD;  Location: [facility] ENDOSCOPY;  Service: Gastroenterology;  Laterality: N/A;    Prior to Admission medications   Medication Sig Start Date End Date Taking?  Authorizing Provider  amLODipine (NORVASC) 10 MG tablet Take 10 mg by mouth daily.    [provider]  hydrochlorothiazide (HYDRODIURIL) 25 MG tablet Take 25 mg by mouth daily.    [provider]  metoprolol tartrate (LOPRESSOR) 25 MG tablet Take 25 mg by mouth 2 (two) times daily.    [provider]  oxyCODONE-acetaminophen (PERCOCET) 10-325 MG tablet Take 1 tablet by mouth 4 (four) times daily. 08/11/17   [provider]    Allergies Ace inhibitors and Vistaril [hydroxyzine hcl]  Family History  Problem Relation Age of Onset  . CAD Mother   . Cirrhosis Father   . CAD Brother     Social History Social History   Tobacco Use  . Smoking status: Current Every Day Smoker    Packs/day: 1.00  . Smokeless tobacco: Never Used  Substance Use Topics  . Alcohol use: Yes    Alcohol/week: 7.0 standard drinks    Types: 7 Cans of beer per week  . Drug use: No    Review of Systems Level 5 caveat:  history/ROS limited by acute/critical illness ____________________________________________   PHYSICAL EXAM:  VITAL SIGNS: ED Triage Vitals  Enc Vitals Group     BP 07/25/18 0012 (!) 112/54     Pulse Rate 07/25/18 0012 (!) 105     Resp 07/25/18 0012 20     Temp 07/25/18 0012 98.3 F (36.8 C)     Temp src --      SpO2 07/25/18 0012 100 %     Weight 07/25/18 0013  68 kg (150 lb)     Height --      Head Circumference --      Peak Flow --      Pain Score --      Pain Loc --      Pain Edu? --      Excl. in GC? --     Constitutional: Patient is unresponsive with agonal breathing. Eyes: Conjunctivae are normal.  Pupils sluggish and minimally responsive. Head: Atraumatic. Nose: No congestion/rhinnorhea. Mouth/Throat: Mucous membranes are dry. Neck: No stridor.  No meningeal signs.   Cardiovascular: Mild tachycardia, regular rhythm. Good peripheral circulation. Grossly normal heart sounds. Respiratory: Agonal respirations.  Coarse and thick lung  sounds.  No wheezing. Gastrointestinal: Soft and nontender. No distention.  Musculoskeletal: No lower extremity tenderness nor edema. No gross deformities of extremities. Neurologic: Patient's neurological exam is somewhat inconsistent.  He will be completely unresponsive with a GCS of about 6 (some withdrawal from pain) and then he will open his eyes but not respond to any stimuli and close them again and not open up.  He is not protecting his airway and having agonal respirations. Skin:  Skin is warm, dry and intact. No rash noted.   ____________________________________________   LABS (all labs ordered are listed, but only abnormal results are displayed)  Labs Reviewed  CBC WITH DIFFERENTIAL/PLATELET - Abnormal; Notable for the following components:      Result Value   Hemoglobin 12.5 (*)    All other components within normal limits  BRAIN NATRIURETIC PEPTIDE - Abnormal; Notable for the following components:   B Natriuretic Peptide 382.0 (*)    All other components within normal limits  BLOOD GAS, ARTERIAL - Abnormal; Notable for the following components:   pH, Arterial 7.31 (*)    pCO2 arterial 57 (*)    pO2, Arterial 477 (*)    Bicarbonate 28.7 (*)    All other components within normal limits  COMPREHENSIVE METABOLIC PANEL - Abnormal; Notable for the following components:   Potassium 2.8 (*)    Chloride 95 (*)    Glucose, Bld 129 (*)    Calcium 8.6 (*)    AST 72 (*)    ALT 57 (*)    All other components within normal limits  LACTIC ACID, PLASMA - Abnormal; Notable for the following components:   Lactic Acid, Venous 2.0 (*)    All other components within normal limits  LACTIC ACID, PLASMA - Abnormal; Notable for the following components:   Lactic Acid, Venous 2.2 (*)    All other components within normal limits  URINALYSIS, ROUTINE W REFLEX MICROSCOPIC - Abnormal; Notable for the following components:   Color, Urine YELLOW (*)    APPearance CLOUDY (*)    Ketones, ur 80  (*)    Protein, ur 100 (*)    All other components within normal limits  URINE DRUG SCREEN, QUALITATIVE ([facility] ONLY) - Abnormal; Notable for the following components:   Amphetamines, Ur Screen POSITIVE (*)    Benzodiazepine, Ur Scrn POSITIVE (*)    All other components within normal limits  MRSA PCR SCREENING  LIPASE, BLOOD  TROPONIN I  GLUCOSE, CAPILLARY  TSH   ____________________________________________  EKG  ED ECG REPORT I, Loleta Rose, the attending physician, personally viewed and interpreted this ECG.  Date: 07/25/2018 EKG Time: 00: 07 Rate: 109 Rhythm: Sinus tachycardia QRS Axis: normal Intervals: normal ST/T Wave abnormalities: Non-specific ST segment / T-wave changes, but no clear evidence of acute ischemia.  Narrative Interpretation: no definitive evidence of acute ischemia; does not meet STEMI criteria.   ____________________________________________  RADIOLOGY I, Loleta Rose, personally viewed and evaluated these images (plain radiographs) as part of my medical decision making, as well as reviewing the written report by the radiologist.  ED MD interpretation: Appropriately placed endotracheal tube and NG tube.  Official radiology report(s):  Dg Chest Portable 1 View  Result Date: 07/25/2018 CLINICAL DATA:  Post intubation EXAM: PORTABLE CHEST 1 VIEW COMPARISON:  Chest x-ray and chest CT 07/24/2018 FINDINGS: Endotracheal tube is 4 cm above the carina. NG tube enters the stomach. Heart is normal size. Patchy opacities in the right lower lung, as seen on prior study, likely scarring. No definite acute confluent opacity or effusion. IMPRESSION: Endotracheal tube 4 cm above the carina. NG tube enters the stomach. Otherwise no change. Electronically Signed   By: Charlett Nose M.D.   On: 07/25/2018 01:06    ____________________________________________   PROCEDURES   Procedure(s) performed (including Critical Care):  Procedure Name: Intubation Date/Time:  07/25/2018 1:24 AM Performed by: Loleta Rose, MD Pre-anesthesia Checklist: Patient identified, Emergency Drugs available, Suction available and Patient being monitored Oxygen Delivery Method: Non-rebreather mask Preoxygenation: Pre-oxygenation with 100% oxygen Induction Type: IV induction and Rapid sequence Laryngoscope Size: Glidescope and 3 Tube size: 7.5 mm Number of attempts: 1 Placement Confirmation: ETT inserted through vocal cords under direct vision,  CO2 detector and Breath sounds checked- equal and bilateral Secured at: 24 cm Tube secured with: ETT holder Dental Injury: Teeth and Oropharynx as per pre-operative assessment     .Critical Care Performed by: Loleta Rose, MD Authorized by: Loleta Rose, MD   Critical care provider statement:    Critical care time (minutes):  60   Critical care time was exclusive of:  Separately billable procedures and treating other patients   Critical care was necessary to treat or prevent imminent or life-threatening deterioration of the following conditions:  Respiratory failure   Critical care was time spent personally by me on the following activities:  Development of treatment plan with patient or surrogate, discussions with consultants, evaluation of patient's response to treatment, examination of patient, obtaining history from patient or surrogate, ordering and performing treatments and interventions, ordering and review of laboratory studies, ordering and review of radiographic studies, pulse oximetry, re-evaluation of patient's condition and review of old charts     ____________________________________________   INITIAL IMPRESSION / MDM / ASSESSMENT AND PLAN / ED COURSE  As part of my medical decision making, I reviewed the following data within the electronic MEDICAL RECORD NUMBER Nursing notes reviewed and incorporated, Labs reviewed , EKG interpreted , Old chart reviewed, Radiograph reviewed , Discussed with admitting physician  and  Notes from prior ED visits      *Ying Rocks was evaluated in Emergency Department on 07/25/2018 for the symptoms described in the history of present illness. He was evaluated in the context of the global COVID-19 pandemic, which necessitated consideration that the patient might be at risk for infection with the SARS-CoV-2 virus that causes COVID-19. Institutional protocols and algorithms that pertain to the evaluation of patients at risk for COVID-19 are in a state of rapid change based on information released by regulatory bodies including the CDC and federal and state organizations. These policies and algorithms were followed during the patient's care in the ED.*  Differential diagnosis includes, but is not limited to, acute respiratory failure secondary to hypoventilation either from medication side effect or underlying lung disease, overdose (  intentional or accidental), acute intracranial bleeding or CVA, ACS, acute infection.  I saw the patient less than 24 hours ago when he left AGAINST MEDICAL ADVICE.  He was COVID-19 negative less than 24 hours ago and I am not repeating the testing tonight.  He is presenting as someone with a pure respiratory failure and I suspect that the "seizure-like activity" that was seen was secondary to his hypoxemia.  However I will obtain CT scan without contrast of his head and cervical spine since the history is somewhat uncertain and make sure there is no evidence of an acute bleed.  I intubated him immediately upon arrival to the ED.  His EKG shows no sign of ischemia.  Lab work is pending.  He received etomidate 20 mg IV and succinylcholine 120 mg IV for RSI and intubation was smoothly with first-pass success.  He has been maintained on a fentanyl infusion and propofol infusion.  1 L bolus is infusing.  Clinical Course as of Jul 25 631  Wed Jul 25, 2018  0101 ABG is generally reassuring with less hypercapnia than last night.  Blood gas, arterial(!) [CF]   0101 CBC is stable with no leukocytosis.   [CF]  0101 Appropriately placed endotracheal tube, official radiology interpretation is pending.  DG Chest Portable 1 View [CF]  0108 Some hypokalemia, ordering potassium 10 meq IV  Comprehensive metabolic panel(!) [CF]  0137 Ordering a second liter of IV fluids.  Ketones, ur(!): 80 [CF]  0137 Like yesterday, the UDS is positive for benzos and amphetamines  Urine Drug Screen, Qualitative ([facility] only)(!) [CF]  0138 The patient is noted to have a lactate of 2. With the current information available to me, I don't think the patient is in septis. The lactate of 2, is related to respiratory distress/ respiratory failure   Lactic Acid, Venous(!!): 2.0 [CF]  0150 The patient is still fighting the intubation even though he is not following commands.  I suspect this is due to his chronic benzodiazepine use and that the propofol is not adequate for him and is causing his blood pressure to drop below as well as bolus of fluids.  I called and spoke with Onalee Hua and the pharmacy regarding the use of a ketamine infusion and he agrees that this would be an excellent adjunct to the existing medications he is on (fentanyl and propofol).  I have ordered a ketamine infusion of 1 mg/kg/h and Onalee Hua will be mixing it and sending it up to [place].   [CF]  0225 I updated the patient's wife by phone about his stable though critical condition and the plan to admit to the ICU.  She is in complete agreement and has been concerned about his noncompliant behaviors and agrees with the current plan.   [CF]  0227 CT head negative, CT cervical spine negative.  I have contacted the hospitalist team (Dr. Anne Hahn and Dr. Sheryle Hail) for admission.   [CF]    Clinical Course User Index [CF] Loleta Rose, MD     ____________________________________________  FINAL CLINICAL IMPRESSION(S) / ED DIAGNOSES  Final diagnoses:  Acute respiratory failure with hypoxia and hypercapnia (HCC)  Respiratory  arrest (HCC)  History of benzodiazepine use  Hypokalemia     MEDICATIONS GIVEN DURING THIS VISIT:  Medications  propofol (DIPRIVAN) 1000 MG/100ML infusion (50 mcg/kg/min  68 kg Intravenous New Bag/Given 07/25/18 0447)  fentaNYL in NS (80mcg/ml) infusion-PREMIX (250 mcg/hr Intravenous Rate/Dose Change 07/25/18 0542)  potassium chloride 10 mEq in 100 mL  IVPB (10 mEq Intravenous New Bag/Given 07/25/18 0601)  ketamine (KETALAR) 1,250 mg in sodium chloride 0.9 % 237.5 mL infusion (1 mg/kg/hr  68 kg Intravenous New Bag/Given 07/25/18 0244)  ketamine (KETALAR) 10 MG/ML injection (has no administration in time range)  enoxaparin (LOVENOX) injection 40 mg (40 mg Subcutaneous Given 07/25/18 0514)  acetaminophen (TYLENOL) tablet 650 mg (has no administration in time range)    Or  acetaminophen (TYLENOL) suppository 650 mg (has no administration in time range)  0.9 % NaCl with KCl 40 mEq / L  infusion (125 mL/hr Intravenous New Bag/Given 07/25/18 0510)  docusate sodium (COLACE) capsule 100 mg (has no administration in time range)  ondansetron (ZOFRAN) tablet 4 mg (has no administration in time range)    Or  ondansetron (ZOFRAN) injection 4 mg (has no administration in time range)  etomidate (AMIDATE) injection 20 mg (20 mg Intravenous Given 07/25/18 0011)  succinylcholine (ANECTINE) injection 120 mg (120 mg Intravenous Given 07/25/18 0012)  succinylcholine (ANECTINE) injection 100 mg (100 mg Intravenous Given 07/25/18 0134)  sodium chloride 0.9 % bolus 1,000 mL (0 mLs Intravenous Stopped 07/25/18 0400)  sodium chloride 0.9 % bolus 1,000 mL (0 mLs Intravenous Stopped 07/25/18 0400)  ketamine (KETALAR) injection 100 mg (100 mg Intravenous Given 07/25/18 0206)  succinylcholine (ANECTINE) injection 100 mg (100 mg Intravenous Given 07/25/18 0150)     ED Discharge Orders    None       Note:  This document was prepared using Dragon voice recognition software and may include unintentional dictation errors.    Loleta Rose, MD 07/25/18 [phone]

## 2018-07-25 NOTE — Progress Notes (Signed)
eLink Physician-Brief Progress Note Patient Name: Leonard Rogers DOB: October 30, 1955 MRN: [id number]   Date of Service  07/25/2018  HPI/Events of Note  Patient intubated for respiratory arrest from benzodiazepine overdose. He has a history of chronic respiratory failure from COPD. He is admitted to the ICU for acute on chronic respiratory failure and altered mental status.  eICU Interventions  New patient evaluation completed.        [name] U [name] 07/25/2018, 3:20 AM

## 2018-07-25 NOTE — TOC Initial Note (Signed)
Transition of Care [facility]) - Initial/Assessment Note    Patient Details  Name: Leonard Rogers MRN: [id number] Date of Birth: Apr 14, 1955  Transition of Care [facility]) CM/SW Contact:    Barrie Dunker, RN Phone Number: 07/25/2018, 11:49 AM  Clinical Narrative:                 Spoke with the spouse Darl Pikes on the phone to complete assessment, patient was intubated when I went into the room She stated that he still drives, walks unassisted but occasionally uses a cane if his legs hurt, he is to call to get a new appointment for Pulmonology in [facility] as he did not keep his last appointment, [place] wife Darl Pikes plans to try to get a new PCP in [place] along with the pulmonologist, He gets his meds from [facility] on [address], He has no needs at home per his wife, they have everything they need now  Expected Discharge Plan: Home/Self Care Barriers to Discharge: Continued Medical Work up   Patient Goals and CMS Choice Patient states their goals for this hospitalization and ongoing recovery are:: go home      Expected Discharge Plan and Services Expected Discharge Plan: Home/Self Care   Discharge Planning Services: CM Consult   Living arrangements for the past 2 months: Single Family Home                                      Prior Living Arrangements/Services Living arrangements for the past 2 months: Single Family Home Lives with:: Spouse Patient language and need for interpreter reviewed:: No Do you feel safe going back to the place where you live?: Yes      Need for Family Participation in Patient Care: No (Comment) Care giver support system in place?: Yes (comment) Current home services: (cane) Criminal Activity/Legal Involvement Pertinent to Current Situation/Hospitalization: No - Comment as needed  Activities of Daily Living      Permission Sought/Granted                  Emotional Assessment Appearance:: Appears stated  age Attitude/Demeanor/Rapport: Engaged Affect (typically observed): Accepting Orientation: : Oriented to Self, Oriented to Place, Oriented to  Time, Oriented to Situation Alcohol / Substance Use: Tobacco Use, Alcohol Use Psych Involvement: No (comment)  Admission diagnosis:  Respiratory arrest (HCC) [R09.2] Hypokalemia [E87.6] Acute respiratory failure with hypoxia and hypercapnia (HCC) [J96.01, J96.02] History of benzodiazepine use [Z87.898] Patient Active Problem List   Diagnosis Date Noted  . Acute on chronic respiratory failure with hypoxemia (HCC) 07/25/2018  . Chest pain 02/15/2016   PCP:  System, Pcp Not In Pharmacy:   [id number] [place], [place] - [address] AT [facility] [address] [place] [address] Phone: [phone] Fax: [phone]     Social Determinants of Health (SDOH) Interventions    Readmission Risk Interventions No flowsheet data found.

## 2018-07-25 NOTE — ED Notes (Signed)
Pt is going to medical imaging with Revonda Standard, RN and RT.

## 2018-07-25 NOTE — Progress Notes (Signed)
Pharmacy Electrolyte Monitoring Consult:  Pharmacy consulted to assist in monitoring and replacing electrolytes in this 63 y.o. male admitted on 07/25/2018 with Respiratory Arrest   Labs:  Sodium (mmol/L)  Date Value  07/25/2018 140   Potassium (mmol/L)  Date Value  07/25/2018 3.9   Magnesium (mg/dL)  Date Value  41/74/0814 1.8   Phosphorus (mg/dL)  Date Value  48/18/5631 2.7   Calcium (mg/dL)  Date Value  49/70/2637 8.1 (L)   Albumin (g/dL)  Date Value  85/88/5027 3.8    Assessment/Plan: Patient receiving NS/Potassium at 176mL/hr.   Potassium phosphate IV x1. Magnesium 2g IV x 1.   Check potassium and phosphorus at 2000. Will obtain all electrolytes with am labs.   Will replace for goal potassium ~4, goal magnesium ~ 2, and goal phosphorus > 2.5.   Pharmacy will continue to monitor and adjust per consult.   5/6 @ 2006:  K = 3.9,  Phos = 2.7 No additional electrolyte supplementation needed at this time.  Will recheck electrolytes on 5/7 with AM labs.    Tamyrah Burbage D 07/25/2018 8:58 PM

## 2018-07-25 NOTE — Consult Note (Addendum)
PULMONARY / CRITICAL CARE MEDICINE   Name: Burlon Centrella MRN: [id number] DOB: [birth year]    ADMISSION DATE:  07/25/2018 CONSULTATION DATE:  07/25/2018   REFERRING MD: Dr. Marcille Blanco  CHIEF COMPLAINT: Respiratory arrest  BRIEF DISCUSSION: 63 year old male with a past medical history notable for COPD and Raynaud's disease admitted due to respiratory arrest requiring intubation in the ED secondary to benzodiazepine overdose and underlying COPD.  HISTORY OF PRESENT ILLNESS:   Mr. Schroader is a 63 year old male with a past medical history notable for COPD, depression, and Raynaud's disease who presents to [facility] ED on 5//6/20 due to respiratory arrest.  He was found unresponsive at home and not breathing.  There was some noted seizure-like activity.  Upon arrival to the ED he was still noted to be unresponsive, requiring intubation.  He was seen in the ED less than 24 hours prior due to hypercapnic respiratory failure, however he chose to leave [place].  His COVID 19 PCR on 07/24/2018 was negative.  Initial work-up in the ED reveals BNP 328, negative troponin, lactic acid 2.0, WBC 9.7.  ABG post intubation with pH 7.31 /CO2 57/O2 473/bicarb 28.7.  CT head is negative, CT cervical spine negative for acute abnormality, does show degenerative disc disease and facet disease throughout the cervical spine.  Urinalysis is negative, urine drug screen is positive for amphetamines and benzodiazepines.  Chest x-ray with patchy opacities in the right lower lobe as seen on prior imaging, likely scarring.  He is admitted to [facility] ICU for treatment of respiratory arrest requiring intubation and questionable seizure-like activity.  PCCM is consulted for further management.  PAST MEDICAL HISTORY :  He  has a past medical history of Anxiety, Arthritis, Chronic pain, Depression, Difficult intubation, Hypertension, and Raynaud disease.  PAST SURGICAL HISTORY: He  has a past surgical history that includes  Appendectomy and Esophagogastroduodenoscopy (egd) with propofol (N/A, 03/17/2017).  Allergies  Allergen Reactions  . Ace Inhibitors Anaphylaxis and Other (See Comments)  . Vistaril [Hydroxyzine Hcl]     Unknown per pt    No current facility-administered medications on file prior to encounter.    Current Outpatient Medications on File Prior to Encounter  Medication Sig  . amLODipine (NORVASC) 10 MG tablet Take 10 mg by mouth daily.  . hydrochlorothiazide (HYDRODIURIL) 25 MG tablet Take 25 mg by mouth daily.  . metoprolol tartrate (LOPRESSOR) 25 MG tablet Take 25 mg by mouth 2 (two) times daily.  Marland Kitchen oxyCODONE-acetaminophen (PERCOCET) 10-325 MG tablet Take 1 tablet by mouth 4 (four) times daily.    FAMILY HISTORY:  His He indicated that his mother is deceased. He indicated that his father is deceased. He indicated that his brother is deceased.   SOCIAL HISTORY: He  reports that he has been smoking. He has been smoking about 1.00 pack per day. He has never used smokeless tobacco. He reports current alcohol use of about 7.0 standard drinks of alcohol per week. He reports that he does not use drugs.    COVID-19 DISASTER DECLARATION:  FULL CONTACT PHYSICAL EXAMINATION WAS NOT POSSIBLE DUE TO TREATMENT OF COVID-19 AND  CONSERVATION OF PERSONAL PROTECTIVE EQUIPMENT, LIMITED EXAM FINDINGS INCLUDE-  Patient assessed or the symptoms described in the history of present illness.  In the context of the Global COVID-19 pandemic, which necessitated consideration that the patient might be at risk for infection with the SARS-CoV-2 virus that causes COVID-19, Institutional protocols and algorithms that pertain to the evaluation of patients at risk for COVID-19 are  in a state of rapid change based on information released by regulatory bodies including the CDC and federal and state organizations. These policies and algorithms were followed during the patient's care while in hospital.   REVIEW OF  SYSTEMS:   Unable to assess due to intubation and sedation  SUBJECTIVE:  Unable to assess due to intubation and sedation  VITAL SIGNS: BP (!) 97/55   Pulse 75   Temp 98.3 F (36.8 C)   Resp 20   Wt 68 kg   SpO2 100%   BMI 23.49 kg/m   HEMODYNAMICS:    VENTILATOR SETTINGS: Vent Mode: AC FiO2 (%):  [100 %] 100 % Set Rate:  [20 bmp] 20 bmp Vt Set:  [500 mL] 500 mL PEEP:  [5 cmH20] 5 cmH20  INTAKE / OUTPUT: No intake/output data recorded.  PHYSICAL EXAMINATION: General: Acutely ill-appearing male, laying in bed, intubated and sedated, in no acute distress Neuro: Sedated, does not follow commands, withdraws from pain HEENT: Atraumatic, normocephalic, neck supple, no JVD, ET tube in place, pupils PERRLA 1 mm sluggish bilaterally Cardiovascular: RRR, S1-S2, no murmurs rubs or gallops, 2+ pulses Lungs: Clear to auscultation bilaterally, even, vent assisted, nonlabored, no accessory muscle use Abdomen: Soft, nontender, nondistended, no guarding or rebound tenderness, bowel sounds positive x4 Musculoskeletal: No deformities, normal bulk and tone, no edema Skin: Warm and dry, no obvious rashes lesions or ulcerations  LABS:  BMET Recent Labs  Lab 07/24/18 0208 07/25/18 0017  NA 136 136  K 3.5 2.8*  CL 94* 95*  CO2 34* 28  BUN 19 14  CREATININE 0.78 0.81  GLUCOSE 157* 129*    Electrolytes Recent Labs  Lab 07/24/18 0208 07/25/18 0017  CALCIUM 8.8* 8.6*  MG 2.2  --     CBC Recent Labs  Lab 07/24/18 0208 07/25/18 0017  WBC 17.6* 9.7  HGB 12.6* 12.5*  HCT 39.8 39.4  PLT 272 274    Coag's No results for input(s): APTT, INR in the last 168 hours.  Sepsis Markers Recent Labs  Lab 07/24/18 0208 07/25/18 0019  LATICACIDVEN 1.2 2.0*    ABG Recent Labs  Lab 07/24/18 0238 07/25/18 0017  PHART 7.30* 7.31*  PCO2ART 73* 57*  PO2ART 85 477*    Liver Enzymes Recent Labs  Lab 07/24/18 0208 07/25/18 0017  AST 16 72*  ALT 26 57*  ALKPHOS 68 87   BILITOT 0.2* 0.8  ALBUMIN 3.9 3.8    Cardiac Enzymes Recent Labs  Lab 07/24/18 0208 07/25/18 0017  TROPONINI <0.03 <0.03    Glucose No results for input(s): GLUCAP in the last 168 hours.  Imaging Ct Head Wo Contrast  Result Date: 07/25/2018 CLINICAL DATA:  Respiratory arrest, seizure like activity EXAM: CT HEAD WITHOUT CONTRAST CT CERVICAL SPINE WITHOUT CONTRAST TECHNIQUE: Multidetector CT imaging of the head and cervical spine was performed following the standard protocol without intravenous contrast. Multiplanar CT image reconstructions of the cervical spine were also generated. COMPARISON:  CT cervical spine 08/04/2010 FINDINGS: CT HEAD FINDINGS Brain: No acute intracranial abnormality. Specifically, no hemorrhage, hydrocephalus, mass lesion, acute infarction, or significant intracranial injury. Vascular: No hyperdense vessel or unexpected calcification. Skull: No acute calvarial abnormality. Sinuses/Orbits: No acute findings Other: None CT CERVICAL SPINE FINDINGS Alignment: No subluxation.  Mild leftward scoliosis. Skull base and vertebrae: No acute fracture. No primary bone lesion or focal pathologic process. Soft tissues and spinal canal: No prevertebral fluid or swelling. No visible canal hematoma. Disc levels: Diffuse degenerative disc and facet disease  throughout the cervical spine. Upper chest: No acute findings Other: Endotracheal tube and NG tube in place. IMPRESSION: No acute intracranial abnormality. 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 No acute bony abnormality. Electronically Signed   By: Rolm Baptise M.D.   On: 07/25/2018 02:16   Ct Angio Chest Pe W And/or Wo Contrast  Result Date: 07/24/2018 CLINICAL DATA:  Shortness of breath EXAM: CT ANGIOGRAPHY CHEST WITH CONTRAST TECHNIQUE: Multidetector CT imaging of the chest was performed using the standard protocol during bolus administration of intravenous contrast. Multiplanar CT image reconstructions  and MIPs were obtained to evaluate the vascular anatomy. CONTRAST:  39m OMNIPAQUE IOHEXOL 350 MG/ML SOLN COMPARISON:  Chest x-ray 07/24/2018 FINDINGS: Cardiovascular: No filling defects in the pulmonary arteries to suggest pulmonary emboli. Coronary artery calcifications in the left anterior descending coronary artery. Scattered aortic calcifications. Heart is normal size. Aorta is normal caliber. Mediastinum/Nodes: Mildly prominent right hilar lymph nodes with a short axis diameter of 13 mm. Left hilar lymph node has a short axis diameter of 9 mm. Subcarinal lymph node has a short axis diameter of 11 mm. No axillary adenopathy. Lungs/Pleura: Postoperative changes in the right hemithorax. Predominantly linear densities in the right lower lobe likely reflects scarring. There is a small gas collection peripherally in the area of scarring which could be within the pleural space. Linear scarring or atelectasis at the left base. No effusions. Upper Abdomen: Imaging into the upper abdomen shows no acute findings. Musculoskeletal: Chest wall soft tissues are unremarkable. No acute bony abnormality. Prior resection of the posterior right 7th and 8th ribs. Review of the MIP images confirms the above findings. IMPRESSION: Postoperative changes on the right with linear peripheral densities in the right lower lobe, likely scarring. There is a small rounded gas collection peripherally in the area of scarring which could be within the pleural space and represent a small loculated pneumothorax. This is likely not clinically significant. Mildly prominent bilateral hilar and mediastinal lymph nodes, likely reactive. Coronary artery disease. No evidence of pulmonary embolus. Aortic Atherosclerosis (ICD10-I70.0). Electronically Signed   By: KRolm BaptiseM.D.   On: 07/24/2018 03:56   Ct Cervical Spine Wo Contrast  Result Date: 07/25/2018 CLINICAL DATA:  Respiratory arrest, seizure like activity EXAM: CT HEAD WITHOUT CONTRAST CT  CERVICAL SPINE WITHOUT CONTRAST TECHNIQUE: Multidetector CT imaging of the head and cervical spine was performed following the standard protocol without intravenous contrast. Multiplanar CT image reconstructions of the cervical spine were also generated. COMPARISON:  CT cervical spine 08/04/2010 FINDINGS: CT HEAD FINDINGS Brain: No acute intracranial abnormality. Specifically, no hemorrhage, hydrocephalus, mass lesion, acute infarction, or significant intracranial injury. Vascular: No hyperdense vessel or unexpected calcification. Skull: No acute calvarial abnormality. Sinuses/Orbits: No acute findings Other: None CT CERVICAL SPINE FINDINGS Alignment: No subluxation.  Mild leftward scoliosis. Skull base and vertebrae: No acute fracture. No primary bone lesion or focal pathologic process. Soft tissues and spinal canal: No prevertebral fluid or swelling. No visible canal hematoma. Disc levels: Diffuse degenerative disc and facet disease throughout the cervical spine. Upper chest: No acute findings Other: Endotracheal tube and NG tube in place. IMPRESSION: No acute intracranial abnormality. 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 No acute bony abnormality. Electronically Signed   By: KRolm BaptiseM.D.   On: 07/25/2018 02:16   Dg Chest Portable 1 View  Result Date: 07/25/2018 CLINICAL DATA:  Post intubation EXAM: PORTABLE CHEST 1 VIEW COMPARISON:  Chest x-ray and chest CT 07/24/2018 FINDINGS:  Endotracheal tube is 4 cm above the carina. NG tube enters the stomach. Heart is normal size. Patchy opacities in the right lower lung, as seen on prior study, likely scarring. No definite acute confluent opacity or effusion. IMPRESSION: Endotracheal tube 4 cm above the carina. NG tube enters the stomach. Otherwise no change. Electronically Signed   By: Rolm Baptise M.D.   On: 07/25/2018 01:06     STUDIES:  CTA Chest 07/24/18>> Postoperative changes on the right with linear peripheral densities in  the right lower lobe, likely scarring. There is a small rounded gas collection peripherally in the area of scarring which could be within the pleural space and represent a small loculated pneumothorax. This is likely not clinically significant. Mildly prominent bilateral hilar and mediastinal lymph nodes, likely reactive.Coronary artery disease. No evidence of pulmonary embolus. CT Head 07/25/18>>No acute intracranial abnormality.  CT Cervical Spine 07/25/18>>Degenerative disc and facet disease throughout the cervical spine. No acute bony abnormality.   CULTURES: COVID-19 07/24/18>>NEGATIVE MRSA PCR 5/6>>Negative  ANTIBIOTICS: N/A  SIGNIFICANT EVENTS: 07/24/18>>Presented to [facility] for Shortness of Breath, Left AMA 07/25/18>>Admission to [facility] ICU for respiratory arrest, required intubation in ED   LINES/TUBES: ETT 5/6>>   ASSESSMENT / PLAN:  PULMONARY A: Acute hypoxic hypercapnic respiratory failure in the setting of respiratory arrest secondary to benzodiazepine overdose Hx: COPD P:   Full vent support, PRVC: 8 cc/kg Wean FiO2 and PEEP as tolerated Follow intermittent ABG and chest x-ray as needed SBT when parameters met VAP bundle PRN bronchodilators  CARDIOVASCULAR A:  Elevated BNP P:  Cardiac monitoring Maintain MAP greater than 65 Levophed if needed to maintain MAP goal Unable to diurese at this time given soft blood pressures Consider echocardiogram  RENAL A:   Hypokalemia P:   Monitor I&O's / urinary output Follow BMP Ensure adequate renal perfusion Avoid nephrotoxic agents as able Replace electrolytes as indicated IV potassium replacement; follow-up serum potassium   GASTROINTESTINAL A:   Mildly elevated LFTs P:   N.p.o. Pepcid for SBP Trend LFTs  HEMATOLOGIC A:   Anemia without signs of active bleeding P:  Monitor for S/Sx of bleeding Trend CBC Lovenox SQ for VTE Prophylaxis  Transfuse for Hgb <7  INFECTIOUS A:   No acute issues P:   Monitor  fever curve Trend WBCs COVID-19 negative 07/24/18  ENDOCRINE A:   Hyperglycemia P:   CBGs SSI Follow ICU hypo-hyperglycemic protocol  NEUROLOGIC A:   Acute metabolic encephalopathy in the setting of benzodiazepine overdose and hypercapnic respiratory failure ?  Seizure activity Hx: Depression P:   RASS goal: -2 to -3 Propofol to maintain RASS goal Avoid sedating meds as able Daily wake-up assessment Provide supportive care Consider flumazenil if difficulty weaning from vent Obtain EEG  FAMILY  - Updates: No family at bedside for update during NP rounds 07/25/2018  - Inter-disciplinary family meet or Palliative Care meeting due by:  07/31/2018    Darel Hong, AGACNP-BC [place] Pager: [phone] Cell: [phone]  07/25/2018, 2:57 AM

## 2018-07-25 NOTE — Progress Notes (Addendum)
Extubated onto 2L [place], pt writhing around bed, stating he cannot "get comfortable". Precedex drip restarted
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
## 2018-07-26 ENCOUNTER — Inpatient Hospital Stay: Payer: Medicaid Other

## 2018-07-26 LAB — COMPREHENSIVE METABOLIC PANEL
ALT: 37 U/L (ref 0–44)
AST: 15 U/L (ref 15–41)
Albumin: 3 g/dL — ABNORMAL LOW (ref 3.5–5.0)
Alkaline Phosphatase: 69 U/L (ref 38–126)
Anion gap: 11 (ref 5–15)
BUN: 7 mg/dL (ref 6–20)
CO2: 23 mmol/L (ref 22–32)
Calcium: 8 mg/dL — ABNORMAL LOW (ref 8.9–10.3)
Chloride: 106 mmol/L (ref 98–111)
Creatinine, Ser: 0.52 mg/dL — ABNORMAL LOW (ref 0.61–1.24)
GFR calc Af Amer: >60 mL/min (ref >60)
GFR calc non Af Amer: >60 mL/min (ref >60)
Glucose, Bld: 80 mg/dL (ref 70–99)
Potassium: 3.8 mmol/L (ref 3.5–5.1)
Sodium: 140 mmol/L (ref 135–145)
Total Bilirubin: 1 mg/dL (ref 0.3–1.2)
Total Protein: 6 g/dL — ABNORMAL LOW (ref 6.5–8.1)

## 2018-07-26 LAB — BRAIN NATRIURETIC PEPTIDE: B Natriuretic Peptide: 300 pg/mL — ABNORMAL HIGH (ref 0.0–100.0)

## 2018-07-26 LAB — MAGNESIUM: Magnesium: 2 mg/dL (ref 1.7–2.4)

## 2018-07-26 LAB — CBC
HCT: 33.5 % — ABNORMAL LOW (ref 39.0–52.0)
Hemoglobin: 11 g/dL — ABNORMAL LOW (ref 13.0–17.0)
MCH: 28.9 pg (ref 26.0–34.0)
MCHC: 32.8 g/dL (ref 30.0–36.0)
MCV: 87.9 fL (ref 80.0–100.0)
Platelets: 203 10*3/uL (ref 150–400)
RBC: 3.81 MIL/uL — ABNORMAL LOW (ref 4.22–5.81)
RDW: 12.6 % (ref 11.5–15.5)
WBC: 11.1 10*3/uL — ABNORMAL HIGH (ref 4.0–10.5)
nRBC: 0 % (ref 0.0–0.2)

## 2018-07-26 LAB — PHOSPHORUS: Phosphorus: 1.4 mg/dL — ABNORMAL LOW (ref 2.5–4.6)

## 2018-07-26 MED ORDER — OXYCODONE HCL 5 MG PO TABS
5.0000 mg | ORAL_TABLET | ORAL | Status: DC | PRN
  Administered 2018-07-26: 5 mg via ORAL
  Filled 2018-07-26: qty 1

## 2018-07-26 MED ORDER — SODIUM PHOSPHATES 45 MMOLE/15ML IV SOLN
30.0000 mmol | Freq: Once | INTRAVENOUS | Status: AC
  Administered 2018-07-26: 30 mmol via INTRAVENOUS
  Filled 2018-07-26: qty 10

## 2018-07-26 MED ORDER — FAMOTIDINE 20 MG PO TABS
20.0000 mg | ORAL_TABLET | Freq: Every day | ORAL | Status: DC
  Administered 2018-07-26: 20 mg via ORAL

## 2018-07-26 MED ORDER — OXYCODONE-ACETAMINOPHEN 5-325 MG PO TABS
1.0000 | ORAL_TABLET | Freq: Four times a day (QID) | ORAL | Status: DC | PRN
  Administered 2018-07-26: 1 via ORAL
  Filled 2018-07-26: qty 1

## 2018-07-26 MED ORDER — OXYCODONE-ACETAMINOPHEN 5-325 MG PO TABS
1.0000 | ORAL_TABLET | ORAL | Status: DC | PRN
  Administered 2018-07-26: 1 via ORAL
  Filled 2018-07-26: qty 1

## 2018-07-26 MED ORDER — OXYCODONE HCL 5 MG PO TABS
5.0000 mg | ORAL_TABLET | Freq: Four times a day (QID) | ORAL | Status: DC | PRN
  Administered 2018-07-26: 5 mg via ORAL
  Filled 2018-07-26: qty 1

## 2018-07-26 MED ORDER — POTASSIUM CHLORIDE ER 10 MEQ PO TBCR: 10.0000 meq | EXTENDED_RELEASE_TABLET | Freq: Every day | ORAL | 0 refills | Status: DC

## 2018-07-26 MED ORDER — POTASSIUM CHLORIDE CRYS ER 20 MEQ PO TBCR
40.0000 meq | EXTENDED_RELEASE_TABLET | Freq: Two times a day (BID) | ORAL | Status: DC
  Administered 2018-07-26: 40 meq via ORAL
  Filled 2018-07-26: qty 2

## 2018-07-26 MED ORDER — SENNOSIDES-DOCUSATE SODIUM 8.6-50 MG PO TABS
2.0000 | ORAL_TABLET | Freq: Two times a day (BID) | ORAL | Status: DC
  Administered 2018-07-26: 2 via ORAL
  Filled 2018-07-26: qty 2

## 2018-07-26 MED ORDER — AMOXICILLIN-POT CLAVULANATE 875-125 MG PO TABS
1.0000 | ORAL_TABLET | Freq: Two times a day (BID) | ORAL | Status: DC
  Administered 2018-07-26: 1 via ORAL
  Filled 2018-07-26 (×2): qty 1

## 2018-07-26 NOTE — Progress Notes (Addendum)
Pharmacy Electrolyte Monitoring Consult:  Pharmacy consulted to assist in monitoring and replacing electrolytes in this 63 y.o. male admitted on 07/25/2018 with Respiratory Arrest   Labs:  Sodium (mmol/L)  Date Value  07/26/2018 140   Potassium (mmol/L)  Date Value  07/26/2018 3.8   Magnesium (mg/dL)  Date Value  02/54/2706 2.0   Phosphorus (mg/dL)  Date Value  23/76/2831 1.4 (L)   Calcium (mg/dL)  Date Value  51/76/1607 8.0 (L)   Albumin (g/dL)  Date Value  37/12/6267 3.0 (L)    Assessment/Plan: MIVF stopped during am ICU rounds.   Sodium phosphate IV x 1.   If patient still admitted, will check electrolytes with am labs.   Will replace for goal potassium ~4, goal magnesium ~ 2, and goal phosphorus > 2.5.   Initiate senna/docusate 2 tabs BID.   Pharmacy will continue to monitor and adjust per consult.    Leonard Rogers L 07/26/2018 11:04 AM

## 2018-07-26 NOTE — Discharge Summary (Signed)
[facility] [place] [facility]   PATIENT NAME: Leonard Rogers    MR#:  [id number]  DATE OF BIRTH:  February 05, 1956  DATE OF ADMISSION:  07/25/2018 ADMITTING PHYSICIAN: Arnaldo NatalMichael S Diamond, MD  DATE OF DISCHARGE: 07/26/2018  PRIMARY CARE PHYSICIAN: System, Pcp Not In   ADMISSION DIAGNOSIS:  Respiratory arrest (HCC) [R09.2] Hypokalemia [E87.6] Acute respiratory failure with hypoxia and hypercapnia (HCC) [J96.01, J96.02] History of benzodiazepine use [Z87.898]  DISCHARGE DIAGNOSIS:  Active Problems:   Acute on chronic respiratory failure with hypoxemia (HCC) Acute hypokalemia Benzodiazepine use Emphysema SECONDARY DIAGNOSIS:   Past Medical History:  Diagnosis Date  . Anxiety   . Arthritis   . Chronic pain   . Depression   . Difficult intubation   . Hypertension   . Raynaud disease      ADMITTING HISTORY The patient with past medical history of COPD, depression and Raynaud disease presents to the emergency department via EMS after becoming unresponsive.  The patient reportedly complained to his wife about chest pain prior to losing consciousness.  Notably the patient was seen in the emergency department last night for respiratory failure with hypercapnia but left AGAINST MEDICAL ADVICE.  Upon arrival to the emergency department tonight the patient was intubated.  Urine toxicology screen was positive for amphetamines and benzodiazepines, the latter of which have been implicated and the patient's respiratory failure in the past.  Once patient was stabilized emergency department staff call the hospitalist service for admission.  HOSPITAL COURSE:  Patient was admitted to ICU with COPD exacerbation and hypercapnia and put on ventilator.  Patient COPD worsened secondary to benzodiazepine use.  She received inhaled steroids and nebulization therapy.  Potassium was aggressively replaced.  Patient did not need any dialysis.  Patient was extubated and was comfortable on room  air.  Tolerated diet well. COVID-19 test is negative. Patient hemodynamically stable and will be discharged home.  CONSULTS OBTAINED:    DRUG ALLERGIES:   Allergies  Allergen Reactions  . Ace Inhibitors Anaphylaxis and Other (See Comments)  . Vistaril [Hydroxyzine Hcl]     Unknown per pt    DISCHARGE MEDICATIONS:   Allergies as of 07/26/2018      Reactions   Ace Inhibitors Anaphylaxis, Other (See Comments)   Vistaril [hydroxyzine Hcl]    Unknown per pt      Medication List    STOP taking these medications   oxyCODONE-acetaminophen 10-325 MG tablet Commonly known as:  PERCOCET   sildenafil 100 MG tablet Commonly known as:  VIAGRA     TAKE these medications   amLODipine 10 MG tablet Commonly known as:  NORVASC Take 10 mg by mouth daily.   celecoxib 200 MG capsule Commonly known as:  CELEBREX Take 200 mg by mouth 2 (two) times daily.   gabapentin 300 MG capsule Commonly known as:  NEURONTIN Take 600 mg by mouth 3 (three) times daily.   hydrochlorothiazide 25 MG tablet Commonly known as:  HYDRODIURIL Take 25 mg by mouth daily.   metoprolol tartrate 25 MG tablet Commonly known as:  LOPRESSOR Take 25 mg by mouth 2 (two) times daily.   omeprazole 40 MG capsule Commonly known as:  PRILOSEC Take 40 mg by mouth daily.   potassium chloride 10 MEQ tablet Commonly known as:  Klor-Con 10 Take 1 tablet (10 mEq total) by mouth daily for 30 days.       Today  Patient seen and evaluated today No shortness of breath Tolerating diet well Hemodynamically  stable Will be discharged home  VITAL SIGNS:  Blood pressure 140/62, pulse 75, temperature 98.7 F (37.1 C), resp. rate (!) 23, weight 67.8 kg, SpO2 97 %.  I/O:    Intake/Output Summary (Last 24 hours) at 07/26/2018 1308 Last data filed at 07/26/2018 0900 Gross per 24 hour  Intake 3026.38 ml  Output 2150 ml  Net 876.38 ml    PHYSICAL EXAMINATION:  Physical Exam  GENERAL:  63 y.o.-year-old patient lying  in the bed with no acute distress.  LUNGS: Normal breath sounds bilaterally, no wheezing, rales,rhonchi or crepitation. No use of accessory muscles of respiration.  CARDIOVASCULAR: S1, S2 normal. No murmurs, rubs, or gallops.  ABDOMEN: Soft, non-tender, non-distended. Bowel sounds present. No organomegaly or mass.  NEUROLOGIC: Moves all 4 extremities. PSYCHIATRIC: The patient is alert and oriented x 3.  SKIN: No obvious rash, lesion, or ulcer.   DATA REVIEW:   CBC Recent Labs  Lab 07/26/18 0425  WBC 11.1*  HGB 11.0*  HCT 33.5*  PLT 203    Chemistries  Recent Labs  Lab 07/26/18 0425  NA 140  K 3.8  CL 106  CO2 23  GLUCOSE 80  BUN 7  CREATININE 0.52*  CALCIUM 8.0*  MG 2.0  AST 15  ALT 37  ALKPHOS 69  BILITOT 1.0    Cardiac Enzymes Recent Labs  Lab 07/25/18 0017  TROPONINI <0.03    Microbiology Results  Results for orders placed or performed during the hospital encounter of 07/25/18  MRSA PCR Screening     Status: None   Collection Time: 07/25/18  4:36 AM  Result Value Ref Range Status   MRSA by PCR NEGATIVE NEGATIVE Final    Comment:        The GeneXpert MRSA Assay (FDA approved for NASAL specimens only), is one component of a comprehensive MRSA colonization surveillance program. It is not intended to diagnose MRSA infection nor to guide or monitor treatment for MRSA infections. Performed at [facility], [address] [place], [place] [address]     RADIOLOGY:  Ct Head Wo Contrast  Result Date: 07/25/2018 CLINICAL DATA:  Respiratory arrest, seizure like activity EXAM: CT HEAD WITHOUT CONTRAST CT CERVICAL SPINE WITHOUT CONTRAST TECHNIQUE: Multidetector CT imaging of the head and cervical spine was performed following the standard protocol without intravenous contrast. Multiplanar CT image reconstructions of the cervical spine were also generated. COMPARISON:  CT cervical spine 08/04/2010 FINDINGS: CT HEAD FINDINGS Brain: No acute  intracranial abnormality. Specifically, no hemorrhage, hydrocephalus, mass lesion, acute infarction, or significant intracranial injury. Vascular: No hyperdense vessel or unexpected calcification. Skull: No acute calvarial abnormality. Sinuses/Orbits: No acute findings Other: None CT CERVICAL SPINE FINDINGS Alignment: No subluxation.  Mild leftward scoliosis. Skull base and vertebrae: No acute fracture. No primary bone lesion or focal pathologic process. Soft tissues and spinal canal: No prevertebral fluid or swelling. No visible canal hematoma. Disc levels: Diffuse degenerative disc and facet disease throughout the cervical spine. Upper chest: No acute findings Other: Endotracheal tube and NG tube in place. IMPRESSION: No acute intracranial abnormality. 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 No acute bony abnormality. Electronically Signed   By: Charlett Nose M.D.   On: 07/25/2018 02:16   Ct Cervical Spine Wo Contrast  Result Date: 07/25/2018 CLINICAL DATA:  Respiratory arrest, seizure like activity EXAM: CT HEAD WITHOUT CONTRAST CT CERVICAL SPINE WITHOUT CONTRAST TECHNIQUE: Multidetector CT imaging of the head and cervical spine was performed following the standard protocol without  intravenous contrast. Multiplanar CT image reconstructions of the cervical spine were also generated. COMPARISON:  CT cervical spine 08/04/2010 FINDINGS: CT HEAD FINDINGS Brain: No acute intracranial abnormality. Specifically, no hemorrhage, hydrocephalus, mass lesion, acute infarction, or significant intracranial injury. Vascular: No hyperdense vessel or unexpected calcification. Skull: No acute calvarial abnormality. Sinuses/Orbits: No acute findings Other: None CT CERVICAL SPINE FINDINGS Alignment: No subluxation.  Mild leftward scoliosis. Skull base and vertebrae: No acute fracture. No primary bone lesion or focal pathologic process. Soft tissues and spinal canal: No prevertebral fluid or swelling. No  visible canal hematoma. Disc levels: Diffuse degenerative disc and facet disease throughout the cervical spine. Upper chest: No acute findings Other: Endotracheal tube and NG tube in place. IMPRESSION: No acute intracranial abnormality. 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 No acute bony abnormality. Electronically Signed   By: Charlett Nose M.D.   On: 07/25/2018 02:16   Dg Chest Port 1 View  Result Date: 07/26/2018 CLINICAL DATA:  Acute respiratory failure EXAM: PORTABLE CHEST 1 VIEW COMPARISON:  07/25/2018 FINDINGS: Endotracheal tube and NG tube have been removed. Right basilar airspace opacities slightly worsened could reflect atelectasis or infiltrate. Left lung clear. Heart is normal size. IMPRESSION: Slight increased right basilar airspace opacity could reflect atelectasis or pneumonia. Electronically Signed   By: Charlett Nose M.D.   On: 07/26/2018 02:03   Dg Chest Portable 1 View  Result Date: 07/25/2018 CLINICAL DATA:  Post intubation EXAM: PORTABLE CHEST 1 VIEW COMPARISON:  Chest x-ray and chest CT 07/24/2018 FINDINGS: Endotracheal tube is 4 cm above the carina. NG tube enters the stomach. Heart is normal size. Patchy opacities in the right lower lung, as seen on prior study, likely scarring. No definite acute confluent opacity or effusion. IMPRESSION: Endotracheal tube 4 cm above the carina. NG tube enters the stomach. Otherwise no change. Electronically Signed   By: Charlett Nose M.D.   On: 07/25/2018 01:06    Follow up with PCP in 1 week.  Management plans discussed with the patient, family and they are in agreement.  CODE STATUS: Full code    Code Status Orders  (From admission, onward)         Start     Ordered   07/25/18 0432  Full code  Continuous     07/25/18 0431        Code Status History    Date Active Date Inactive Code Status Order ID Comments User Context   02/15/2016 0919 02/15/2016 1500 Full Code [id number]  Alford Highland, MD ED       TOTAL TIME TAKING CARE OF THIS PATIENT ON DAY OF DISCHARGE: more than 35 minutes.   Ihor Austin M.D on 07/26/2018 at 1:08 PM  Between 7am to 6pm - Pager - [phone]  After 6pm go to [email or web address] - password [email or web address]  [facility] [place] [facility]  Office  [phone]  CC: Primary care physician; System, Pcp Not In  Note: This dictation was prepared with Dragon dictation along with smaller phrase technology. Any transcriptional errors that result from this process are unintentional.

## 2018-07-26 NOTE — Progress Notes (Signed)
Discharge teaching done. All questions answered.No prescriptions sent home.

## 2018-07-26 NOTE — Care Plan (Signed)
After further assessment and discussion of patient's  status and medical condition during multidisciplinary rounds, the plan is outlined as below:  1.  No sequela after extubation.  In no respiratory distress.  2.  Respiratory failure likely due to polysubstance use amphetamines/benzodiazepines/narcotics.  3.  Small patch of right lower lobe atelectasis versus pneumonia.  Recommend Augmentin 1 tablet twice daily x5 days.  Currently afebrile and nontoxic-appearing.  4.  Desires to go home.  See no compelling reason to keep the patient hospitalized.  Appears to be at baseline.     Gailen Shelter, M.D.  [facility] Medicine

## 2018-07-26 NOTE — Progress Notes (Addendum)
Late note. Bedside swallow evaluation done. Swallows well. Diet ordered. Very agitated with being in the hospital. Now presently on room air.  Dr.Pyreddyl in to see patient. Discussed possibility of going home late today. Patient states he will be cooperative at home. Discussed D/C with wife and if she could handle him there. All parties are in agreement with care. Plan to D/C home with wife.

## 2018-07-26 NOTE — Progress Notes (Signed)
Advanced care plan. Purpose of the Encounter: CODE STATUS Parties in Attendance: Patient Patient's Decision Capacity: Good Subjective/Patient's story: The patient with past medical history of COPD, depression and Raynaud disease presents to the emergency department via EMS after becoming unresponsive.  The patient reportedly complained to his wife about chest pain prior to losing consciousness.  Notably the patient was seen in the emergency department last night for respiratory failure with hypercapnia but left AGAINST MEDICAL ADVICE.  Upon arrival to the emergency department tonight the patient was intubated.  Urine toxicology screen was positive for amphetamines and benzodiazepines, the latter of which have been implicated and the patient's respiratory failure in the past.  Once patient was stabilized emergency department staff call the hospitalist service for admission. Objective/Medical story Patient was admitted to ICU on ventilator.  Needs nebulization therapy and intensivist consultation. Goals of care determination:  Advance care directives goals of care and treatment plan discussed Patient is full resuscitation CODE STATUS: Full code Time spent discussing advanced care planning: 16 minutes

## 2018-07-26 NOTE — Progress Notes (Signed)
1440 D/C home with wife.

## 2018-08-02 ENCOUNTER — Telehealth: Payer: Self-pay | Admitting: Internal Medicine

## 2018-08-02 NOTE — Telephone Encounter (Signed)
Called patient for COVID-19 pre-screening for in office visit.  Have you recently traveled any where out of the local area in the last 2 weeks? no  Have you been in close contact with a person diagnosed with COVID-19 within the last 2 weeks? no  Do you currently have any of the following symptoms? If so, when did they start? Cough     Diarrhea   Joint Pain Fever      Muscle Pain   Red eyes Shortness of breath   Abdominal pain  Vomiting Loss of smell    Rash    Sore Throat Headache    Weakness   Bruising or bleeding  Sob with exertion, which is his baseline per pt.    Okay to proceed with visit.

## 2018-08-03 ENCOUNTER — Institutional Professional Consult (permissible substitution): Payer: Medicaid Other | Admitting: Pulmonary Disease

## 2018-08-03 ENCOUNTER — Institutional Professional Consult (permissible substitution): Payer: Medicaid Other | Admitting: Internal Medicine

## 2018-08-03 NOTE — Progress Notes (Deleted)
[facility]      Assessment and Plan:     Date: 08/03/2018  MRN# [id number] Leonard Rogers Jul 29, 1955  Referring Physician:   Edd FabianJoseph Rogers is a 63 y.o. old male seen in consultation for chief complaint of:   No chief complaint on file.   HPI:  Patient was admitted to the hospital recently, presented with unconsciousness, requiring intubation with hypercapnia, thought to be secondary to benzodiazepine overdose.  Was discharged on 07/26/2018 after a 1 day admission.  **CT chest 07/24/2018>> imaging personally reviewed, there is a pleural-based opacity in the right lung posteriorly, bibasilar strandy atelectasis, mild right hilar and subcarinal lymphadenopathy, lungs are otherwise unremarkable.   PMHX:   Past Medical History:  Diagnosis Date  . Anxiety   . Arthritis   . Chronic pain   . Depression   . Difficult intubation   . Hypertension   . Raynaud disease    Surgical Hx:  Past Surgical History:  Procedure Laterality Date  . APPENDECTOMY    . ESOPHAGOGASTRODUODENOSCOPY (EGD) WITH PROPOFOL N/A 03/17/2017   Procedure: ESOPHAGOGASTRODUODENOSCOPY (EGD) WITH PROPOFOL, removal of foreign body;  Surgeon: Toledo, Boykin Nearingeodoro K, MD;  Location: [facility] ENDOSCOPY;  Service: Gastroenterology;  Laterality: N/A;   Family Hx:  Family History  Problem Relation Age of Onset  . CAD Mother   . Cirrhosis Father   . CAD Brother    Social Hx:   Social History   Tobacco Use  . Smoking status: Current Every Day Smoker    Packs/day: 1.00  . Smokeless tobacco: Never Used  Substance Use Topics  . Alcohol use: Yes    Alcohol/week: 7.0 standard drinks    Types: 7 Cans of beer per week  . Drug use: No   Medication:    Current Outpatient Medications:  .  amLODipine (NORVASC) 10 MG tablet, Take 10 mg by mouth daily., Disp: , Rfl:  .  celecoxib (CELEBREX) 200 MG capsule, Take 200 mg by mouth 2 (two) times daily., Disp: , Rfl:  .  gabapentin (NEURONTIN) 300  MG capsule, Take 600 mg by mouth 3 (three) times daily., Disp: , Rfl:  .  hydrochlorothiazide (HYDRODIURIL) 25 MG tablet, Take 25 mg by mouth daily., Disp: , Rfl:  .  metoprolol tartrate (LOPRESSOR) 25 MG tablet, Take 25 mg by mouth 2 (two) times daily., Disp: , Rfl:  .  omeprazole (PRILOSEC) 40 MG capsule, Take 40 mg by mouth daily., Disp: , Rfl:  .  potassium chloride (KLOR-CON 10) 10 MEQ tablet, Take 1 tablet (10 mEq total) by mouth daily for 30 days., Disp: 30 tablet, Rfl: 0   Allergies:  Ace inhibitors and Vistaril [hydroxyzine hcl]  Review of Systems: Gen:  Denies  fever, sweats, chills HEENT: Denies blurred vision, double vision. bleeds, sore throat Cvc:  No dizziness, chest pain. Resp:   Denies cough or sputum production, shortness of breath Gi: Denies swallowing difficulty, stomach pain. Gu:  Denies bladder incontinence, burning urine Ext:   No Joint pain, stiffness. Skin: No skin rash,  hives  Endoc:  No polyuria, polydipsia. Psych: No depression, insomnia. Other:  All other systems were reviewed with the patient and were negative other that what is mentioned in the HPI.   Physical Examination:   VS: There were no vitals taken for this visit.  General Appearance: No distress  Neuro:without focal findings,  speech normal,  HEENT: PERRLA, EOM intact.   Pulmonary: normal breath sounds, No wheezing.  CardiovascularNormal S1,S2.  No m/r/g.  Abdomen: Benign, Soft, non-tender. Renal:  No costovertebral tenderness  GU:  No performed at this time. Endoc: No evident thyromegaly, no signs of acromegaly. Skin:   warm, no rashes, no ecchymosis  Extremities: normal, no cyanosis, clubbing.  Other findings:    LABORATORY PANEL:   CBC No results for input(s): WBC, HGB, HCT, PLT in the last 168 hours. ------------------------------------------------------------------------------------------------------------------  Chemistries  No results for input(s): NA, K, CL, CO2,  GLUCOSE, BUN, CREATININE, CALCIUM, MG, AST, ALT, ALKPHOS, BILITOT in the last 168 hours.  Invalid input(s): GFRCGP ------------------------------------------------------------------------------------------------------------------  Cardiac Enzymes No results for input(s): TROPONINI in the last 168 hours. ------------------------------------------------------------  RADIOLOGY:  No results found.     Thank  you for the consultation and for allowing [facility] to assist in the care of your patient. Our recommendations are noted above.  Please contact us if we can be of further service.   Wells Guiles, M.D., F.C.C.P.  Board Certified in Internal Medicine, Pulmonary Medicine, Critical Care Medicine, and Sleep Medicine.  [facility] Office Number: [phone]   08/03/2018

## 2018-08-24 ENCOUNTER — Telehealth: Payer: Self-pay | Admitting: Internal Medicine

## 2018-08-24 NOTE — Progress Notes (Signed)
[facility] [place] [facility] Consultation      Assessment and Plan:  Dyspnea on exertion with likely COPD.   - Patient has some mild dyspnea exertion, likely secondary to COPD with ongoing lung disease.  In addition he had a previous right Eloesser procedure which may have reduced lung function to some degree.  Patient has had multiple hospital admissions and 2 intubations in the past year for respiratory failure, he is at high risk of further respiratory decline. - We will start Dulera inhaler, asked to use nebulizer only as needed. - We will check pulmonary function test.  Excessive daytime sleepiness.  - Patient has presented to the hospital with respiratory failure with elevated CO2 levels, which may have been due to substance overdose.  However he does have witnessed apneas, will send for sleep study.  Nicotine abuse. - Patient has significantly cut down on his smoking, though he continues to smoke few cigarettes per day.  Encouraged to complete smoke cessation, spent 4 minutes in discussion.  Orders Placed This Encounter  Procedures   Pulmonary Function Test [facility] Only   Split night study   Meds ordered this encounter  Medications   mometasone-formoterol (DULERA) 200-5 MCG/ACT AERO    Sig: Inhale 2 puffs into the lungs 2 (two) times daily.    Dispense:  1 Inhaler    Refill:  5   Return in about 3 months (around 11/27/2018).   Date: 08/24/2018  MRN# [id number] Carston Behrns 08/14/1955    Mishawn Bartkiewicz is a 63 y.o. old male seen in consultation for chief complaint of:    Chief Complaint  Patient presents with   pulmonary consult    self ref- dx with COPD 2019. pt reports of sob with exertion & non prod cough.    HPI:  Patient was admitted to the ED on 07/24/2018, due to unresponsiveness to his wife.  Lab testing showed positive for benzos and amphetamines, patient was recommended admission but left AMA.  Presented with unconsciousness the following day,  requiring intubation with hypercapnia, thought to be secondary to benzodiazepine overdose.  Was discharged on 07/26/2018 after a 1 day admission.  Arterial blood gas at that time showed significant hypercapnia, though previous metabolic panel shows CO2 levels within normal range. He has had streptococcal pneumonia and was admitted to [facility] from 08/24/17 to 6/16. He initially had a right chest tube placed, and had some issues with noncompliance with disconnecting both his IV tubing and chest tubes. Reviewed [facility] notes, including pulmonary note from 09/03/17, wherein he left AMA.  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  He is smoking a couple of cigs per day, down from 2.5 ppd.  He uses no inhalers, he feels that his breathing is ok other than hot humid days. He uses nebulizer 2-3 times per day.  He has 1 cat, not in bedroom. Has gerd controlled with omeprazole. Denies sinus drainage.  He wife notes that he snores at night, he is mildly sleepy during the day, his wife has noted that he has stopped breathing in his sleep.   **CT chest 07/24/2018>> imaging personally reviewed, there is a pleural-based opacity in the right lung posteriorly, bibasilar strandy atelectasis, corresponding to previous thoracotomy area, mild right hilar and subcarinal lymphadenopathy, mild emphysematous changes, in the apices.   PMHX:   Past Medical History:  Diagnosis Date   Anxiety    Arthritis    Chronic pain  Depression    Difficult intubation    Hypertension    Raynaud disease    Surgical Hx:  Past Surgical History:  Procedure Laterality Date   APPENDECTOMY     ESOPHAGOGASTRODUODENOSCOPY (EGD) WITH PROPOFOL N/A 03/17/2017   Procedure: ESOPHAGOGASTRODUODENOSCOPY (EGD) WITH PROPOFOL, removal of foreign body;  Surgeon: Toledo, Boykin Nearing, MD;  Location: [facility] ENDOSCOPY;  Service: Gastroenterology;  Laterality: N/A;   Family Hx:  Family  History  Problem Relation Age of Onset   CAD Mother    Cirrhosis Father    CAD Brother    Social Hx:   Social History   Tobacco Use   Smoking status: Current Every Day Smoker    Packs/day: 1.00   Smokeless tobacco: Never Used  Substance Use Topics   Alcohol use: Yes    Alcohol/week: 7.0 standard drinks    Types: 7 Cans of beer per week   Drug use: No   Medication:    Current Outpatient Medications:    amLODipine (NORVASC) 10 MG tablet, Take 10 mg by mouth daily., Disp: , Rfl:    celecoxib (CELEBREX) 200 MG capsule, Take 200 mg by mouth 2 (two) times daily., Disp: , Rfl:    gabapentin (NEURONTIN) 300 MG capsule, Take 600 mg by mouth 3 (three) times daily., Disp: , Rfl:    hydrochlorothiazide (HYDRODIURIL) 25 MG tablet, Take 25 mg by mouth daily., Disp: , Rfl:    metoprolol tartrate (LOPRESSOR) 25 MG tablet, Take 25 mg by mouth 2 (two) times daily., Disp: , Rfl:    omeprazole (PRILOSEC) 40 MG capsule, Take 40 mg by mouth daily., Disp: , Rfl:    potassium chloride (KLOR-CON 10) 10 MEQ tablet, Take 1 tablet (10 mEq total) by mouth daily for 30 days., Disp: 30 tablet, Rfl: 0   Allergies:  Ace inhibitors and Vistaril [hydroxyzine hcl]  Review of Systems: Gen:  Denies  fever, sweats, chills HEENT: Denies blurred vision, double vision. bleeds, sore throat Cvc:  No dizziness, chest pain. Resp:   Denies cough or sputum production, shortness of breath Gi: Denies swallowing difficulty, stomach pain. Gu:  Denies bladder incontinence, burning urine Ext:   No Joint pain, stiffness. Skin: No skin rash,  hives  Endoc:  No polyuria, polydipsia. Psych: No depression, insomnia. Other:  All other systems were reviewed with the patient and were negative other that what is mentioned in the HPI.   Physical Examination:   VS: BP 130/78 (BP Location: Left Arm, Cuff Size: Normal)    Pulse (!) 122    Temp 97.8 F (36.6 C) (Oral)    Ht  (1.702 m)    Wt 134 lb 12.8 oz (61.1 kg)     SpO2 100%    BMI 21.11 kg/m   General Appearance: No distress  Neuro:without focal findings,  speech normal,  HEENT: PERRLA, EOM intact.   Pulmonary: normal breath sounds, No wheezing.  Large right-sided scar over posterior right hemithorax. CardiovascularNormal S1,S2.  No m/r/g.   Abdomen: Benign, Soft, non-tender. Renal:  No costovertebral tenderness  GU:  No performed at this time. Endoc: No evident thyromegaly, no signs of acromegaly. Skin:   warm, no rashes, no ecchymosis  Extremities: normal, no cyanosis, clubbing.  Other findings:    LABORATORY PANEL:   CBC No results for input(s): WBC, HGB, HCT, PLT in the last 168 hours. ------------------------------------------------------------------------------------------------------------------  Chemistries  No results for input(s): NA, K, CL, CO2, GLUCOSE, BUN, CREATININE, CALCIUM, MG, AST, ALT, ALKPHOS, BILITOT in the last  168 hours.  Invalid input(s): GFRCGP ------------------------------------------------------------------------------------------------------------------  Cardiac Enzymes No results for input(s): TROPONINI in the last 168 hours. ------------------------------------------------------------  RADIOLOGY:  No results found.     Thank  you for the consultation and for [facility] to assist in the care of your patient. Our recommendations are noted above.  Please contact us if we can be of further service.   Wells Guileseep Rustyn Conery, M.D., F.C.C.P.  Board Certified in Internal Medicine, Pulmonary Medicine, Critical Care Medicine, and Sleep Medicine.  [place] [facility] Office Number: [phone]   08/24/2018

## 2018-08-24 NOTE — Telephone Encounter (Signed)
Called pt to confirm 08/27/2018 appt.,screen, and give directions.

## 2018-08-27 ENCOUNTER — Other Ambulatory Visit: Payer: Self-pay

## 2018-08-27 ENCOUNTER — Encounter: Payer: Self-pay | Admitting: Internal Medicine

## 2018-08-27 ENCOUNTER — Ambulatory Visit (INDEPENDENT_AMBULATORY_CARE_PROVIDER_SITE_OTHER): Payer: Medicaid Other | Admitting: Internal Medicine

## 2018-08-27 VITALS — BP 130/78 | HR 122 | Temp 97.8°F | Ht 67.0 in | Wt 134.8 lb

## 2018-08-27 DIAGNOSIS — F17210 Nicotine dependence, cigarettes, uncomplicated: Secondary | ICD-10-CM

## 2018-08-27 DIAGNOSIS — G4719 Other hypersomnia: Secondary | ICD-10-CM

## 2018-08-27 MED ORDER — MOMETASONE FURO-FORMOTEROL FUM 200-5 MCG/ACT IN AERO: 2.0000 | INHALATION_SPRAY | Freq: Two times a day (BID) | RESPIRATORY_TRACT | 5 refills | Status: DC

## 2018-08-27 NOTE — Patient Instructions (Addendum)
Start dulera inhaler 2 puffs twice daily, rinse mouth after use.  Will send for sleep test and lung function test.    Sleep Apnea    Sleep apnea is disorder that affects a person's sleep. A person with sleep apnea has abnormal pauses in their breathing when they sleep. It is hard for them to get a good sleep. This makes a person tired during the day. It also can lead to other physical problems. There are three types of sleep apnea. One type is when breathing stops for a short time because your airway is blocked (obstructive sleep apnea). Another type is when the brain sometimes fails to give the normal signal to breathe to the muscles that control your breathing (central sleep apnea). The third type is a combination of the other two types.  HOME CARE   Take all medicine as told by your doctor.  Avoid alcohol, calming medicines (sedatives), and depressant drugs.  Try to lose weight if you are overweight. Talk to your doctor about a healthy weight goal.  Your doctor may have you use a device that helps to open your airway. It can help you get the air that you need. It is called a positive airway pressure (PAP) device.   MAKE SURE YOU:   Understand these instructions.  Will watch your condition.  Will get help right away if you are not doing well or get worse.  It may take approximately 1 month for you to get used to wearing her CPAP every night.  Be sure to work with your machine to get used to it, be patient, it may take time!  If you have trouble tolerating CPAP DO NOT RETURN YOUR MACHINE; Contact our office to see if we can help you tolerate the CPAP better first!

## 2018-09-07 ENCOUNTER — Other Ambulatory Visit: Payer: Self-pay | Admitting: Internal Medicine

## 2018-09-07 MED ORDER — NYSTATIN 100000 UNIT/ML MT SUSP: 5.0000 mL | Freq: Four times a day (QID) | OROMUCOSAL | 0 refills | Status: DC

## 2018-09-07 NOTE — Telephone Encounter (Signed)
DR please advise. Pt is questioning if she would continue the dulera.

## 2018-09-07 NOTE — Telephone Encounter (Signed)
DR please advise. Thanks 

## 2018-09-12 ENCOUNTER — Encounter: Payer: Self-pay | Admitting: Student in an Organized Health Care Education/Training Program

## 2018-09-12 NOTE — Progress Notes (Signed)
Patient's Name: Leonard Rogers  MRN: [id number]  Referring Provider: Rossie Muskrat  DOB: 08-22-1955  PCP: System, Pcp Not In  DOS: 09/13/2018  Note by: Gillis Santa, MD  Service setting: Ambulatory outpatient  Specialty: Interventional Pain Management  Location: [facility] Virtual Visit  Visit type: Initial Patient Evaluation  Patient type: New Patient   Pain Management Virtual Encounter Note - Virtual Visit via Telephone Telehealth (real-time audio visits between healthcare provider and patient).   Patient's Phone No.:  [phone] (home); There is no such number on file (mobile).; (Preferred) [phone] [email or web address] .[id number] [address], [address] [place] [address] Phone: [phone] Fax: [phone]    Pre-screening note:  Our staff contacted Mr. Soberano and offered him an "in person", "face-to-face" appointment versus a telephone encounter. He indicated preferring the telephone encounter, at this time.  Primary Reason(s) for Visit: Tele-Encounter for initial evaluation of one or more chronic problems (new to examiner) potentially causing chronic pain, and posing a threat to normal musculoskeletal function. (Level of risk: High) CC: Neck Pain, Back Pain (lower), Knee Pain (bilateral), and Pain (chronic -arthritis)  I contacted Carrolyn Leigh on 09/13/2018 via telephone.      I clearly identified myself as Gillis Santa, MD. I verified that I was speaking with the correct person using two identifiers (Name: Rami Budhu, and date of birth: 1956/02/13).  Advanced Informed Consent I sought verbal advanced consent from Carrolyn Leigh for virtual visit interactions. I informed Mr. Quirk of possible security and privacy concerns, risks, and limitations associated with providing "not-in-person" medical evaluation and management services. I also  informed Mr. Vertz of the availability of "in-person" appointments. Finally, I informed him that there would be a charge for the virtual visit and that he could be  personally, fully or partially, financially responsible for it. Mr. Mcdonagh expressed understanding and agreed to proceed.   HPI  Mr. Jayson is a 63 y.o. year old, male patient, contacted today for an initial evaluation of his chronic pain. 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  Pain Assessment: Location:   Neck Radiating: shoulders bilateral, down arms to hands bilateral Onset: More than a month ago Duration: Chronic pain Quality: Aching, Constant, Numbness, Discomfort, Throbbing, Tiring Severity: 7 /10 (subjective, self-reported pain score)  Effect on ADL: Prolonged walking, prolonged standing, "Doing anything, My work has cause all my pain" Timing: Constant Modifying factors: Oxycodone for last 10 years-12 years  Onset and Duration: Present longer than 3 months Cause of pain: Arthritis Severity: Getting worse, NAS-11 at its worse: 10/10, NAS-11 at its best: 5/10, NAS-11 now: 7/10 and NAS-11 on the average: 7/10 Timing: Afternoon, During activity or exercise and After a period of immobility Aggravating Factors: Bending, Climbing, Lifiting, Motion, Prolonged sitting, Prolonged standing, Squatting, Twisting, Walking, Walking uphill, Walking downhill and Working Alleviating Factors: Hot packs, Medications and Resting Associated Problems: Numbness, Pain that wakes patient up and Pain that does not allow patient to sleep Quality of Pain: Aching, Cramping, Dreadful, Exhausting, Getting longer, Horrible, Nagging, Stabbing, Throbbing, Tingling and Tiring Previous Examinations or Tests: MRI scan and  X-rays Previous Treatments: Narcotic medications  Living in [place] for the last 3 years- states that he has been on chronic  opioid therapy for 10-12 years ( prescribed by Dr. Satira Mccallum in [place], Percocet 10 mg 3 times daily as needed for the last 2 years). Poor historian. Chronic neck pain radiates to hands.  States that he has difficulty performing ADLs.  Significant cervical facet arthropathy. Also states that he has arthritis in low back and knees.  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    Of note- patient did have ED visit on 07/25/2018 for unresponsiveness. His wife called 911 because he was apparently not responding to her.  She says that he usually takes a lot of Xanax and has accidentally taken too much in the past.  The patient is very somnolent but arousable to physical stimuli and loud voices.  Chronic opioid and benzodiazepine dependence as well as medical noncompliance.  Patient was not forthright about his benzodiazepine misuse/abuse.  There are no fills of alprazolam on his [place] PMP suggesting that he gets it from nonprescription source which he did confess to later in our conversation.   I was very clear with the patient that  He would not be a candidate for chronic opioid therapy at this clinic.   We will work-up his pain via radiographic imaging and lab work and consider interventional therapies only.  Patient states that he has done physical therapy in the remote past which was not effective in managing his pain or improving his mobility.  He denies having done any sort of injection therapy.   Historic Controlled Substance Pharmacotherapy Review   08/10/2018  1   08/10/2018  Oxycodone-Acetaminophen 10-325  90.00 30 Do Agb   [id number]   [place] ([id number])   0  45.00 MME  Medicaid   [place]    Medications: Bottles not available for inspection. Pharmacodynamics: Desired  effects: Analgesia: The patient reports <50% benefit. Reported improvement in function: The patient reports medications have not provided significant benefit Clinically meaningful improvement in function (CMIF): Medication does not meet basic CMIF Perceived effectiveness: None Undesirable effects: Side-effects or Adverse reactions: None reported Historical Monitoring: The patient  reports no history of drug use. List of all UDS Test(s): Lab Results  Component Value Date   MDMA NONE DETECTED 07/25/2018   MDMA NONE DETECTED 07/24/2018   MDMA NONE DETECTED 09/04/2017   COCAINSCRNUR NONE DETECTED 07/25/2018   COCAINSCRNUR NONE DETECTED 07/24/2018   COCAINSCRNUR NONE DETECTED 09/04/2017   PCPSCRNUR NONE DETECTED 07/25/2018   PCPSCRNUR NONE DETECTED 07/24/2018   PCPSCRNUR NONE DETECTED 09/04/2017   THCU NONE DETECTED 07/25/2018   THCU NONE DETECTED 07/24/2018   THCU NONE DETECTED 09/04/2017   ETH <10 07/24/2018   ETH <10 09/04/2017   List of other Serum/Urine Drug Screening Test(s):  Lab Results  Component Value Date   COCAINSCRNUR NONE DETECTED 07/25/2018   COCAINSCRNUR NONE DETECTED 07/24/2018   COCAINSCRNUR NONE DETECTED 09/04/2017   THCU NONE DETECTED 07/25/2018   THCU NONE DETECTED 07/24/2018   THCU NONE DETECTED 09/04/2017   ETH <10 07/24/2018   ETH <10 09/04/2017   Historical Background Evaluation: [place] PMP: PDMP reviewed during this encounter. Six (6) year initial data search conducted.             [place] Department of public safety, offender search: Editor, commissioning Information) Non-contributory Risk Assessment Profile: Aberrant behavior: continued use despite claims of ineffective analgesia, diminished ability to recognize a problem with one's behavior or use of the medication, inability  to consider abstinence, non-adherence to medication regimen, non-compliance with medical instructions on the proper use of the medication, obtaining controlled substances from inappropriate sources,  obtaining medications from illicit sources, prescription  drug abuse, prescription misuse, taking more medication than prescribed and unsafe use of medication Risk factors for fatal opioid overdose: Benzodiazepine use, caucasian, concomitant use of Benzodiazepines, COPD or asthma, history of non-compliance with medical advice, history of previous overdose, history of substance abuse, male gender, nicotine dependence and signs of non-medical use of Opioids PMP NARX Overdose Risk Score: 500 Fatal overdose hazard ratio (HR): Calculation deferred Non-fatal overdose hazard ratio (HR): Calculation deferred Risk of opioid abuse or dependence: 0.7-3.0% with doses ? 36 MME/day and 6.1-26% with doses ? 120 MME/day. Substance use disorder (SUD) risk level: High Personal History of Substance Abuse (SUD-Substance use disorder):  Alcohol: Negative  Illegal Drugs: Negative  Rx Drugs: Negative  ORT Risk Level calculation: Low Risk Opioid Risk Tool - 09/12/18 1004      Family History of Substance Abuse   Alcohol  Negative    Illegal Drugs  Negative    Rx Drugs  Negative      Personal History of Substance Abuse   Alcohol  Negative    Illegal Drugs  Negative    Rx Drugs  Negative      Age   Age between 35-45 years   No      Psychological Disease   Psychological Disease  Negative    Depression  Negative      Total Score   Opioid Risk Tool Scoring  0    Opioid Risk Interpretation  Low Risk      ORT Scoring interpretation table:  Score <3 = Low Risk for SUD  Score between 4-7 = Moderate Risk for SUD  Score >8 = High Risk for Opioid Abuse   Pharmacologic Plan: None at this point.            Initial impression: Poor candidate for opioid analgesics. High risk for opioid misuse abuse.  Will not be a candidate for chronic opioid therapy at this clinic.  Meds   Current Outpatient Medications:  .  albuterol (PROVENTIL) (2.5 MG/3ML) 0.083% nebulizer solution, Take 2.5 mg by nebulization every 6  (six) hours as needed for wheezing or shortness of breath., Disp: , Rfl:  .  amLODipine (NORVASC) 10 MG tablet, Take 10 mg by mouth daily., Disp: , Rfl:  .  celecoxib (CELEBREX) 200 MG capsule, Take 200 mg by mouth 2 (two) times daily., Disp: , Rfl:  .  gabapentin (NEURONTIN) 300 MG capsule, Take 600 mg by mouth 3 (three) times daily., Disp: , Rfl:  .  hydrochlorothiazide (HYDRODIURIL) 25 MG tablet, Take 25 mg by mouth daily., Disp: , Rfl:  .  metoprolol tartrate (LOPRESSOR) 25 MG tablet, Take 25 mg by mouth 2 (two) times daily., Disp: , Rfl:  .  mometasone-formoterol (DULERA) 200-5 MCG/ACT AERO, Inhale 2 puffs into the lungs 2 (two) times daily., Disp: 1 Inhaler, Rfl: 5 .  nystatin (MYCOSTATIN) 100000 UNIT/ML suspension, Take 5 mLs (500,000 Units total) by mouth 4 (four) times daily., Disp: 60 mL, Rfl: 0 .  omeprazole (PRILOSEC) 40 MG capsule, Take 40 mg by mouth daily., Disp: , Rfl:  .  potassium chloride (KLOR-CON 10) 10 MEQ tablet, Take 1 tablet (10 mEq total) by mouth daily for 30 days., Disp: 30 tablet, Rfl: 0  ROS  Cardiovascular: High blood pressure Pulmonary or Respiratory: Lung problems Neurological: No reported neurological  signs or symptoms such as seizures, abnormal skin sensations, urinary and/or fecal incontinence, being born with an abnormal open spine and/or a tethered spinal cord Review of Past Neurological Studies:  Results for orders placed or performed during the hospital encounter of 07/25/18  CT Head Wo Contrast   Narrative   CLINICAL DATA:  Respiratory arrest, seizure like activity  EXAM: CT HEAD WITHOUT CONTRAST  CT CERVICAL SPINE WITHOUT CONTRAST  TECHNIQUE: Multidetector CT imaging of the head and cervical spine was performed following the standard protocol without intravenous contrast. Multiplanar CT image reconstructions of the cervical spine were also generated.  COMPARISON:  CT cervical spine 08/04/2010  FINDINGS: CT HEAD FINDINGS  Brain: No acute  intracranial abnormality. Specifically, no hemorrhage, hydrocephalus, mass lesion, acute infarction, or significant intracranial injury.  Vascular: No hyperdense vessel or unexpected calcification.  Skull: No acute calvarial abnormality.  Sinuses/Orbits: No acute findings  Other: None  CT CERVICAL SPINE FINDINGS  Alignment: No subluxation.  Mild leftward scoliosis.  Skull base and vertebrae: No acute fracture. No primary bone lesion or focal pathologic process.  Soft tissues and spinal canal: No prevertebral fluid or swelling. No visible canal hematoma.  Disc levels: Diffuse degenerative disc and facet disease throughout the cervical spine.  Upper chest: No acute findings  Other: Endotracheal tube and NG tube in place.  IMPRESSION: No acute intracranial abnormality.  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 No acute bony abnormality.   Electronically Signed   By: Rolm Baptise M.D.   On: 07/25/2018 02:16    Psychological-Psychiatric: Difficulty sleeping and or falling asleep Gastrointestinal: No reported gastrointestinal signs or symptoms such as vomiting or evacuating blood, reflux, heartburn, alternating episodes of diarrhea and constipation, inflamed or scarred liver, or pancreas or irrregular and/or infrequent bowel movements Genitourinary: No reported renal or genitourinary signs or symptoms such as difficulty voiding or producing urine, peeing blood, non-functioning kidney, kidney stones, difficulty emptying the bladder, difficulty controlling the flow of urine, or chronic kidney disease Hematological: No reported hematological signs or symptoms such as prolonged bleeding, low or poor functioning platelets, bruising or bleeding easily, hereditary bleeding problems, low energy levels due to low hemoglobin or being anemic Endocrine: No reported endocrine signs or symptoms such as high or low blood sugar, rapid heart rate due to high thyroid  levels, obesity or weight gain due to slow thyroid or thyroid disease Rheumatologic: No reported rheumatological signs and symptoms such as fatigue, joint pain, tenderness, swelling, redness, heat, stiffness, decreased range of motion, with or without associated rash Musculoskeletal: Negative for myasthenia gravis, muscular dystrophy, multiple sclerosis or malignant hyperthermia Work History: Working part time  Allergies  Mr. Strohmeier is allergic to ace inhibitors and vistaril [hydroxyzine hcl].  Laboratory Chemistry   SAFETY SCREENING Profile Lab Results  Component Value Date   SARSCOV2NAA NEGATIVE 07/24/2018   MRSAPCR NEGATIVE 07/25/2018   Inflammation Markers (CRP: Acute Phase) (ESR: Chronic Phase) Lab Results  Component Value Date   LATICACIDVEN 2.2 ([place]) 07/25/2018                        Renal Function Markers Lab Results  Component Value Date   BUN 7 07/26/2018   CREATININE 0.52 (L) 07/26/2018   GFRAA >60 07/26/2018   GFRNONAA >60 07/26/2018                             Hepatic Function Markers Lab Results  Component Value Date   AST 15 07/26/2018   ALT 37 07/26/2018   ALBUMIN 3.0 (L) 07/26/2018   ALKPHOS 69 07/26/2018   LIPASE 23 07/25/2018                        Electrolytes Lab Results  Component Value Date   NA 140 07/26/2018   K 3.8 07/26/2018   CL 106 07/26/2018   CALCIUM 8.0 (L) 07/26/2018   MG 2.0 07/26/2018   PHOS 1.4 (L) 07/26/2018                        Coagulation Parameters Lab Results  Component Value Date   PLT 203 07/26/2018                        Cardiovascular Markers Lab Results  Component Value Date   BNP 300.0 (H) 07/26/2018   TROPONINI <0.03 07/25/2018   HGB 11.0 (L) 07/26/2018   HCT 33.5 (L) 07/26/2018                         ID Test(s) Lab Results  Component Value Date   SARSCOV2NAA NEGATIVE 07/24/2018   MRSAPCR NEGATIVE 07/25/2018    CA Markers No results found for: CEA, CA125, LABCA2                       Endocrine Markers Lab Results  Component Value Date   TSH 0.141 (L) 07/25/2018                        Note: Lab results reviewed.  Imaging Review  Cervical Imaging:  Cervical CT wo contrast:  Results for orders placed during the hospital encounter of 07/25/18  CT Cervical Spine Wo Contrast   Narrative CLINICAL DATA:  Respiratory arrest, seizure like activity  EXAM: CT HEAD WITHOUT CONTRAST  CT CERVICAL SPINE WITHOUT CONTRAST  TECHNIQUE: Multidetector CT imaging of the head and cervical spine was performed following the standard protocol without intravenous contrast. Multiplanar CT image reconstructions of the cervical spine were also generated.  COMPARISON:  CT cervical spine 08/04/2010  FINDINGS: CT HEAD FINDINGS  Brain: No acute intracranial abnormality. Specifically, no hemorrhage, hydrocephalus, mass lesion, acute infarction, or significant intracranial injury.  Vascular: No hyperdense vessel or unexpected calcification.  Skull: No acute calvarial abnormality.  Sinuses/Orbits: No acute findings  Other: None  CT CERVICAL SPINE FINDINGS  Alignment: No subluxation.  Mild leftward scoliosis.  Skull base and vertebrae: No acute fracture. No primary bone lesion or focal pathologic process.  Soft tissues and spinal canal: No prevertebral fluid or swelling. No visible canal hematoma.  Disc levels: Diffuse degenerative disc and facet disease throughout the cervical spine.  Upper chest: No acute findings  Other: Endotracheal tube and NG tube in place.  IMPRESSION: No acute intracranial abnormality.  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 No acute bony abnormality.   Electronically Signed   By: Rolm Baptise M.D.   On: 07/25/2018 02:16     Complexity Note: Imaging results reviewed. Results shared with Mr. Waterhouse, using Layman's terms.                         PFSH  Drug: Mr. Groot  reports no history of drug  use. Alcohol:  reports current alcohol use  of about 7.0 standard drinks of alcohol per week. Tobacco:  reports that he has quit smoking. He has a 45.00 pack-year smoking history. He has never used smokeless tobacco. Medical:  has a past medical history of Anxiety, Arthritis, Chronic pain, COPD (chronic obstructive pulmonary disease) ([place]), Depression, Difficult intubation, Hypertension, and Raynaud disease. Family: family history includes CAD in his brother and mother; Cirrhosis in his father.  Past Surgical History:  Procedure Laterality Date  . APPENDECTOMY    . ESOPHAGOGASTRODUODENOSCOPY (EGD) WITH PROPOFOL N/A 03/17/2017   Procedure: ESOPHAGOGASTRODUODENOSCOPY (EGD) WITH PROPOFOL, removal of foreign body;  Surgeon: Toledo, Benay Pike, MD;  Location: [facility] END[facility]COPY;  Service: Gastroenterology;  Laterality: N/A;   Active Ambulatory Problems    Diagnosis Date Noted  . Chest pain 02/15/2016  . Acute on chronic respiratory failure with hypoxemia ([place]) 07/25/2018  . Chronic low back pain without sciatica 11/20/2014  . Chronic pain of both knees 11/20/2014  . Tobacco abuse 11/20/2014  . 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 Opioid dependence ([place]) 09/13/2018  . Medical non-compliance 09/13/2018   Resolved Ambulatory Problems    Diagnosis Date Noted  . No Resolved Ambulatory Problems   Past Medical History:  Diagnosis Date  . Anxiety   . Arthritis   . Chronic pain   . COPD (chronic obstructive pulmonary disease) ([place])   . Depression   . Difficult intubation   . Hypertension   . Raynaud disease    Assessment  Primary Diagnosis & Pertinent Problem List: The primary encounter diagnosis was Tobacco abuse. 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  Visit Diagnosis (New problems to examiner): 1. Tobacco abuse   2. Benzodiazepine misuse ([facility])   3. Opioid dependence with opioid-induced disorder ([place])   4. Medical non-compliance   5. Chronic pain of both knees   6. Chronic bilateral low back pain without sciatica   7. Bilateral primary osteoarthritis of knee   8. Cervical pain (neck)    Plan of Care (Initial workup plan)  I had extensive discussion with the patient about the goals of pain management.  We discussed nonpharmacological approaches to pain management that include physical therapy, dieting, sleep hygiene, psychotherapy, interventional therapy.  We discussed the importance of understanding the type of pain including neuropathic, nociceptive, centralized.  I also stressed the importance of multimodal analgesia with an emphasis on nondrug modalities including self management, behavioral health support and physical therapy.  We discussed the importance of physical therapy and how a individualized physical therapy and occupational therapy program tailored to patient limitations can be helpful at improving physical function. We also discussed the importance of insomnia and disrupted sleep and how improved sleep hygiene and cognitive therapy could be helpful.  Psychotherapy including CBT, mind-body therapies, pain coping strategies can be helpful for patients whose pain impacts mood, sleep, quality of life, relationships with others.  We discussed avoiding benzodiazepines.  I also had an extensive discussion with the patient about interventional therapies which is my expertise and how these could be incorporated into an effective multimodal pain management plan.   Patient is high risk for opioid prescribing. The patient will not be a candidate for opioid therapy through this clinic. Patient will be optimized on non-opioid adjunctive analgesics and a comprehensive pain management plan was developed.  Chronic opioid and  benzodiazepine dependence as well as medical noncompliance.  Patient was not forthright about his benzodiazepine misuse/abuse. Of note- patient did have ED visit on 07/25/2018 for unresponsiveness. His wife called 911 because he was apparently not responding to her.  She says that he usually takes a lot of Xanax and has accidentally taken too much in the past.  The patient is very somnolent but arousable to physical stimuli and loud voices.   There are no fills of alprazolam on his [place] PMP suggesting that he gets it from nonprescription source which he did confess to later in our conversation.   I was very clear with the patient that  He would not be a candidate for chronic opioid therapy at this clinic.   We will work-up his pain via radiographic imaging and lab work and consider interventional therapies only.  Patient states that he has done physical therapy in the remote past which was not effective in managing his pain or improving his mobility.  He denies having done any sort of injection therapy.    Imaging Orders     DG Lumbar Spine Complete W/Bend     DG Knee 1-2 Views Left     DG Knee 1-2 Views Right     DG Cervical Spine Complete Referral Orders  No referral(s) requested today    Procedure Orders     KNEE INJECTION  Medication management to continue with PCP.  We will only focus on interventional pain therapies  Interventional management options: Mr. Giammarino was informed that there is no guarantee that he would be a candidate for interventional therapies. The decision will be based on the results of diagnostic studies, as well as Mr. Bazinet risk profile.  Procedure(s) under consideration:  Bilateral knee intra-articular steroid injection Bilateral knee intra-articular Hyalgan injection Bilateral knee genicular nerve block and possible radiofrequency ablation Lumbar facet medial branch nerve block Lumbar epidural steroid injection Cervical facet medial branch nerve  block Cervical ESI   Provider-requested follow-up: No follow-ups on file.  Future Appointments  Date Time Provider [place]  11/20/2018 12:15 PM [facility] None    Total duration of non-face-to-face encounter:58mnutes.  Primary Care Physician: System, Pcp Not In [facility] Note by: BGillis Santa MD Date: 09/13/2018; Time: 11:13 AM  Note: This dictation was prepared with Dragon dictation. Any transcriptional errors that may result from this process are unintentional.

## 2018-09-13 ENCOUNTER — Ambulatory Visit
Payer: Medicaid Other | Attending: Student in an Organized Health Care Education/Training Program | Admitting: Student in an Organized Health Care Education/Training Program

## 2018-09-13 ENCOUNTER — Encounter: Payer: Self-pay | Admitting: Student in an Organized Health Care Education/Training Program

## 2018-09-13 VITALS — Ht 67.0 in | Wt 145.0 lb

## 2018-09-13 DIAGNOSIS — Z720 Tobacco use: Secondary | ICD-10-CM

## 2018-09-13 DIAGNOSIS — F1129 Opioid dependence with unspecified opioid-induced disorder: Secondary | ICD-10-CM | POA: Diagnosis not present

## 2018-09-13 DIAGNOSIS — M545 Low back pain, unspecified: Secondary | ICD-10-CM

## 2018-09-13 DIAGNOSIS — M542 Cervicalgia: Secondary | ICD-10-CM

## 2018-09-13 DIAGNOSIS — F1120 Opioid dependence, uncomplicated: Secondary | ICD-10-CM | POA: Insufficient documentation

## 2018-09-13 DIAGNOSIS — Z9119 Patient's noncompliance with other medical treatment and regimen: Secondary | ICD-10-CM | POA: Diagnosis not present

## 2018-09-13 DIAGNOSIS — F1310 Sedative, hypnotic or anxiolytic abuse, uncomplicated: Secondary | ICD-10-CM | POA: Diagnosis not present

## 2018-09-13 DIAGNOSIS — M25562 Pain in left knee: Secondary | ICD-10-CM

## 2018-09-13 DIAGNOSIS — G8929 Other chronic pain: Secondary | ICD-10-CM

## 2018-09-13 DIAGNOSIS — M170 Bilateral primary osteoarthritis of knee: Secondary | ICD-10-CM

## 2018-09-13 DIAGNOSIS — F1390 Sedative, hypnotic, or anxiolytic use, unspecified, uncomplicated: Secondary | ICD-10-CM | POA: Insufficient documentation

## 2018-09-13 DIAGNOSIS — Z91199 Patient's noncompliance with other medical treatment and regimen due to unspecified reason: Secondary | ICD-10-CM | POA: Insufficient documentation

## 2018-09-13 DIAGNOSIS — M25561 Pain in right knee: Secondary | ICD-10-CM

## 2018-09-20 ENCOUNTER — Other Ambulatory Visit: Payer: Self-pay

## 2018-09-27 ENCOUNTER — Telehealth: Payer: Self-pay

## 2018-09-27 NOTE — Telephone Encounter (Signed)
LM for patient to call for covid testing date and time. Date: 10/02/18 12:30-2:30 for split night sleep study 10/05/18.

## 2018-09-28 NOTE — Telephone Encounter (Signed)
LMOM for pt to return call re testing dates and times.

## 2018-10-01 NOTE — Telephone Encounter (Signed)
Left message for pt

## 2018-10-01 NOTE — Telephone Encounter (Addendum)
Spoke to pt and attempted to give date/time of covid testing.  Pt requested that I call back after 12:00 to provide him with this information.

## 2018-10-02 ENCOUNTER — Other Ambulatory Visit: Admission: RE | Admit: 2018-10-02 | Payer: Medicaid Other | Source: Ambulatory Visit

## 2018-10-02 NOTE — Telephone Encounter (Signed)
Left message for pt

## 2018-10-02 NOTE — Telephone Encounter (Signed)
Left message x3 for pt.  Will close encounter per office protocol.  Letter has been mailed to address on file.

## 2018-10-10 ENCOUNTER — Telehealth: Payer: Self-pay

## 2018-10-10 NOTE — Telephone Encounter (Signed)
Received disability paperwork from [employer] offices for patient. Faxed to [employer] with confirmation.

## 2018-11-16 ENCOUNTER — Telehealth: Payer: Self-pay | Admitting: Internal Medicine

## 2018-11-16 NOTE — Telephone Encounter (Signed)
Lm x2 for pt.  

## 2018-11-16 NOTE — Telephone Encounter (Signed)
Lm to make pt aware of date/time of covid testing.  [facility]/19/2018 [facility]

## 2018-11-19 ENCOUNTER — Other Ambulatory Visit: Admission: RE | Admit: 2018-11-19 | Payer: Medicaid Other | Source: Ambulatory Visit

## 2018-11-19 NOTE — Telephone Encounter (Signed)
Received call from [place] with PAT, pt no showed covid test.   Suanne Marker, can you assist with rescheduling PFT?

## 2018-11-19 NOTE — Telephone Encounter (Signed)
Called and spoke pt and made him aware that PFT will need to be rescheduled, due to not having covid test.  Pt is aware that Suanne Marker will contact him to reschedule PFT.

## 2018-11-19 NOTE — Telephone Encounter (Signed)
Spoke with  Marcie Bal and R/S PFT to Thurs 02/28/2019 to arrive at 12:00 to the [facility].  Advised patient that nurse would be in contact with him to schedule the COVID test prior to appointment.  Appointment information mailed to patient along with instructions.    Nothing else needed at this time. Rhonda J Cobb

## 2018-11-19 NOTE — Telephone Encounter (Signed)
Left message with pt's EC, susan.

## 2018-11-19 NOTE — Telephone Encounter (Signed)
Left message x3 for pt.  

## 2018-11-19 NOTE — Telephone Encounter (Signed)
Pt returning call.  [phone].

## 2018-11-20 ENCOUNTER — Ambulatory Visit: Payer: Medicaid Other

## 2018-11-29 ENCOUNTER — Telehealth: Payer: Self-pay | Admitting: Internal Medicine

## 2018-11-29 ENCOUNTER — Other Ambulatory Visit: Payer: Self-pay

## 2018-11-29 NOTE — Telephone Encounter (Signed)
Also left message with pt's EC, Manuela Schwartz to relay below message.

## 2018-11-29 NOTE — Telephone Encounter (Signed)
Pt's spouse, Susan(DPR) is aware of date/time of covid test and voiced her understanding. Nothing further is needed.

## 2018-11-29 NOTE — Telephone Encounter (Addendum)
Left message to relay date/time of covid test for sleep study 11/30/2018 [facility]30[facility]

## 2018-11-30 ENCOUNTER — Other Ambulatory Visit
Admission: RE | Admit: 2018-11-30 | Discharge: 2018-11-30 | Disposition: A | Discharge status: Home / Self Care | Payer: Medicaid Other | Source: Ambulatory Visit | Attending: Internal Medicine | Admitting: Internal Medicine

## 2018-11-30 ENCOUNTER — Other Ambulatory Visit: Payer: Self-pay

## 2018-11-30 DIAGNOSIS — Z01812 Encounter for preprocedural laboratory examination: Secondary | ICD-10-CM | POA: Diagnosis present

## 2018-11-30 DIAGNOSIS — Z20828 Contact with and (suspected) exposure to other viral communicable diseases: Secondary | ICD-10-CM | POA: Insufficient documentation

## 2018-12-01 LAB — SARS CORONAVIRUS 2 (TAT 6-24 HRS): SARS Coronavirus 2: NEGATIVE

## 2018-12-05 ENCOUNTER — Ambulatory Visit: Payer: Medicaid Other | Attending: Internal Medicine

## 2019-02-25 ENCOUNTER — Telehealth: Payer: Self-pay | Admitting: Internal Medicine

## 2019-02-25 ENCOUNTER — Other Ambulatory Visit: Payer: Self-pay

## 2019-02-25 ENCOUNTER — Encounter: Payer: Self-pay | Admitting: Internal Medicine

## 2019-02-25 NOTE — Telephone Encounter (Signed)
Pt is aware of date/time of covid test.   

## 2019-02-27 ENCOUNTER — Other Ambulatory Visit
Admission: RE | Admit: 2019-02-27 | Discharge: 2019-02-27 | Disposition: A | Discharge status: Home / Self Care | Payer: Medicaid Other | Source: Ambulatory Visit | Attending: Internal Medicine | Admitting: Internal Medicine

## 2019-02-27 ENCOUNTER — Other Ambulatory Visit: Payer: Self-pay

## 2019-02-27 DIAGNOSIS — Z20828 Contact with and (suspected) exposure to other viral communicable diseases: Secondary | ICD-10-CM | POA: Diagnosis not present

## 2019-02-27 DIAGNOSIS — Z01812 Encounter for preprocedural laboratory examination: Secondary | ICD-10-CM | POA: Diagnosis not present

## 2019-02-28 ENCOUNTER — Ambulatory Visit: Payer: Medicaid Other

## 2019-02-28 LAB — SARS CORONAVIRUS 2 (TAT 6-24 HRS): SARS Coronavirus 2: NEGATIVE

## 2019-03-01 NOTE — Telephone Encounter (Signed)
Per Orland Jarred, RRT, pt did not show for PFT.

## 2019-06-29 ENCOUNTER — Other Ambulatory Visit: Payer: Self-pay

## 2019-06-29 ENCOUNTER — Emergency Department: Payer: Medicaid Other

## 2019-06-29 ENCOUNTER — Emergency Department
Admission: EM | Admit: 2019-06-29 | Discharge: 2019-06-29 | Discharge status: Against Medical Advice | Payer: Medicaid Other | Attending: Internal Medicine | Admitting: Internal Medicine

## 2019-06-29 DIAGNOSIS — Z8249 Family history of ischemic heart disease and other diseases of the circulatory system: Secondary | ICD-10-CM | POA: Diagnosis not present

## 2019-06-29 DIAGNOSIS — G8929 Other chronic pain: Secondary | ICD-10-CM | POA: Diagnosis not present

## 2019-06-29 DIAGNOSIS — E876 Hypokalemia: Secondary | ICD-10-CM | POA: Diagnosis not present

## 2019-06-29 DIAGNOSIS — T50901A Poisoning by unspecified drugs, medicaments and biological substances, accidental (unintentional), initial encounter: Secondary | ICD-10-CM | POA: Diagnosis not present

## 2019-06-29 DIAGNOSIS — I469 Cardiac arrest, cause unspecified: Secondary | ICD-10-CM | POA: Diagnosis not present

## 2019-06-29 DIAGNOSIS — Z888 Allergy status to other drugs, medicaments and biological substances status: Secondary | ICD-10-CM | POA: Insufficient documentation

## 2019-06-29 DIAGNOSIS — Z20822 Contact with and (suspected) exposure to covid-19: Secondary | ICD-10-CM | POA: Diagnosis not present

## 2019-06-29 DIAGNOSIS — J449 Chronic obstructive pulmonary disease, unspecified: Secondary | ICD-10-CM | POA: Insufficient documentation

## 2019-06-29 DIAGNOSIS — F1120 Opioid dependence, uncomplicated: Secondary | ICD-10-CM | POA: Diagnosis not present

## 2019-06-29 DIAGNOSIS — I10 Essential (primary) hypertension: Secondary | ICD-10-CM | POA: Diagnosis not present

## 2019-06-29 DIAGNOSIS — I4891 Unspecified atrial fibrillation: Secondary | ICD-10-CM | POA: Diagnosis not present

## 2019-06-29 DIAGNOSIS — Z87891 Personal history of nicotine dependence: Secondary | ICD-10-CM | POA: Insufficient documentation

## 2019-06-29 DIAGNOSIS — Z8379 Family history of other diseases of the digestive system: Secondary | ICD-10-CM | POA: Insufficient documentation

## 2019-06-29 DIAGNOSIS — Z79899 Other long term (current) drug therapy: Secondary | ICD-10-CM | POA: Insufficient documentation

## 2019-06-29 LAB — COMPREHENSIVE METABOLIC PANEL
ALT: 51 U/L — ABNORMAL HIGH (ref 0–44)
AST: 33 U/L (ref 15–41)
Albumin: 3.6 g/dL (ref 3.5–5.0)
Alkaline Phosphatase: 57 U/L (ref 38–126)
Anion gap: 11 (ref 5–15)
BUN: 22 mg/dL (ref 8–23)
CO2: 28 mmol/L (ref 22–32)
Calcium: 8.6 mg/dL — ABNORMAL LOW (ref 8.9–10.3)
Chloride: 96 mmol/L — ABNORMAL LOW (ref 98–111)
Creatinine, Ser: 0.81 mg/dL (ref 0.61–1.24)
GFR calc Af Amer: >60 mL/min (ref >60)
GFR calc non Af Amer: >60 mL/min (ref >60)
Glucose, Bld: 128 mg/dL — ABNORMAL HIGH (ref 70–99)
Potassium: 2.6 mmol/L — CL (ref 3.5–5.1)
Sodium: 135 mmol/L (ref 135–145)
Total Bilirubin: 0.6 mg/dL (ref 0.3–1.2)
Total Protein: 6.8 g/dL (ref 6.5–8.1)

## 2019-06-29 LAB — CBC
HCT: 32.2 % — ABNORMAL LOW (ref 39.0–52.0)
Hemoglobin: 10.6 g/dL — ABNORMAL LOW (ref 13.0–17.0)
MCH: 28.6 pg (ref 26.0–34.0)
MCHC: 32.9 g/dL (ref 30.0–36.0)
MCV: 86.8 fL (ref 80.0–100.0)
Platelets: 263 10*3/uL (ref 150–400)
RBC: 3.71 MIL/uL — ABNORMAL LOW (ref 4.22–5.81)
RDW: 13.6 % (ref 11.5–15.5)
WBC: 12.1 10*3/uL — ABNORMAL HIGH (ref 4.0–10.5)
nRBC: 0 % (ref 0.0–0.2)

## 2019-06-29 LAB — ACETAMINOPHEN LEVEL: Acetaminophen (Tylenol), Serum: <10 ug/mL — ABNORMAL LOW (ref 10–30)

## 2019-06-29 LAB — TROPONIN I (HIGH SENSITIVITY): Troponin I (High Sensitivity): 5 ng/L (ref <18)

## 2019-06-29 LAB — LIPASE, BLOOD: Lipase: 23 U/L (ref 11–51)

## 2019-06-29 LAB — LACTIC ACID, PLASMA: Lactic Acid, Venous: 1.2 mmol/L (ref 0.5–1.9)

## 2019-06-29 LAB — CK: Total CK: 223 U/L (ref 49–397)

## 2019-06-29 LAB — ETHANOL: Alcohol, Ethyl (B): <10 mg/dL (ref <10)

## 2019-06-29 LAB — AMMONIA: Ammonia: 23 umol/L (ref 9–35)

## 2019-06-29 LAB — SALICYLATE LEVEL: Salicylate Lvl: 10.7 mg/dL (ref 7.0–30.0)

## 2019-06-29 LAB — MAGNESIUM: Magnesium: 2.3 mg/dL (ref 1.7–2.4)

## 2019-06-29 MED ORDER — IOHEXOL 300 MG/ML  SOLN
100.0000 mL | Freq: Once | INTRAMUSCULAR | Status: AC | PRN
  Administered 2019-06-29: 16:00:00 100 mL via INTRAVENOUS

## 2019-06-29 MED ORDER — ASPIRIN 81 MG PO CHEW: 324.0000 mg | CHEWABLE_TABLET | ORAL | Status: DC

## 2019-06-29 MED ORDER — ACETAMINOPHEN 325 MG PO TABS: 650.0000 mg | ORAL_TABLET | Freq: Four times a day (QID) | ORAL | Status: DC | PRN

## 2019-06-29 MED ORDER — AMLODIPINE BESYLATE 5 MG PO TABS: 10.0000 mg | ORAL_TABLET | Freq: Every day | ORAL | Status: DC

## 2019-06-29 MED ORDER — POTASSIUM CHLORIDE 20 MEQ/15ML (10%) PO SOLN: 40.0000 meq | Freq: Once | ORAL | Status: DC

## 2019-06-29 MED ORDER — METOPROLOL TARTRATE 25 MG PO TABS: 25.0000 mg | ORAL_TABLET | Freq: Two times a day (BID) | ORAL | Status: DC

## 2019-06-29 MED ORDER — SODIUM CHLORIDE 0.9 % IV BOLUS
1000.0000 mL | Freq: Once | INTRAVENOUS | Status: AC
  Administered 2019-06-29: 1000 mL via INTRAVENOUS

## 2019-06-29 MED ORDER — POTASSIUM CHLORIDE 10 MEQ/100ML IV SOLN
10.0000 meq | Freq: Once | INTRAVENOUS | Status: AC
  Administered 2019-06-29: 10 meq via INTRAVENOUS
  Filled 2019-06-29: qty 100

## 2019-06-29 MED ORDER — ALBUTEROL SULFATE (2.5 MG/3ML) 0.083% IN NEBU
2.5000 mg | INHALATION_SOLUTION | Freq: Four times a day (QID) | RESPIRATORY_TRACT | Status: DC | PRN
  Filled 2019-06-29: qty 3

## 2019-06-29 MED ORDER — AMITRIPTYLINE HCL 50 MG PO TABS: 25.0000 mg | ORAL_TABLET | Freq: Every day | ORAL | Status: DC

## 2019-06-29 MED ORDER — MAGNESIUM SULFATE IN D5W 1-5 GM/100ML-% IV SOLN
1.0000 g | Freq: Once | INTRAVENOUS | Status: AC
  Administered 2019-06-29: 1 g via INTRAVENOUS
  Filled 2019-06-29: qty 100

## 2019-06-29 MED ORDER — ENOXAPARIN SODIUM 40 MG/0.4ML ~~LOC~~ SOLN: 40.0000 mg | SUBCUTANEOUS | Status: DC

## 2019-06-29 MED ORDER — DOCUSATE SODIUM 100 MG PO CAPS: 100.0000 mg | ORAL_CAPSULE | Freq: Two times a day (BID) | ORAL | Status: DC | PRN

## 2019-06-29 MED ORDER — CELECOXIB 200 MG PO CAPS
200.0000 mg | ORAL_CAPSULE | Freq: Two times a day (BID) | ORAL | Status: DC
  Filled 2019-06-29: qty 1

## 2019-06-29 MED ORDER — ASPIRIN 300 MG RE SUPP: 300.0000 mg | RECTAL | Status: DC

## 2019-06-29 MED ORDER — HYDROCHLOROTHIAZIDE 25 MG PO TABS: 25.0000 mg | ORAL_TABLET | Freq: Every day | ORAL | Status: DC

## 2019-06-29 MED ORDER — PANTOPRAZOLE SODIUM 40 MG PO TBEC: 40.0000 mg | DELAYED_RELEASE_TABLET | Freq: Every day | ORAL | Status: DC

## 2019-06-29 MED ORDER — POLYETHYLENE GLYCOL 3350 17 G PO PACK: 17.0000 g | PACK | Freq: Every day | ORAL | Status: DC | PRN

## 2019-06-29 NOTE — ED Notes (Signed)
Patient transported to CT 

## 2019-06-29 NOTE — ED Provider Notes (Signed)
[facility] Emergency Department Provider Note  ____________________________________________   First MD Initiated Contact with Patient 06/29/19 1304     (approximate)  I have reviewed the triage vital signs and the nursing notes.   HISTORY  Chief Complaint Drug Overdose and Cardiac Arrest  EM caveat: Patient unable to recall events  HPI Leonard Rogers is a 64 y.o. male history of COPD hypertension chronic pain  Patient presents today, reportedly found unresponsive in a vehicle.  CPR was administered by bystanders, EMS arrived on scene patient was pulseless and apneic.  He was administered CPR and then 2 mg Narcan by EMS.  He started to arouse shortly thereafter breathing spontaneously.  Had a King airway inserted briefly which was then removed.  Patient reports is not sure what happened.  EMS reports he never had a shockable rhythm.  He went from asystole to possible A. Fib  Patient reports he not sure what happened.  Does take pain medications, but he is not able to tell me when he last took or how much.  He denies being in pain.  No fevers or chills.  Denies any recent illness.  Patient answers he is not sure if he could have overtaken medications or not  Does not want hurt himself or anyone else   Past Medical History:  Diagnosis Date  . Anxiety   . Arthritis   . Chronic pain   . COPD (chronic obstructive pulmonary disease) (HCC)   . Depression   . Difficult intubation   . Hypertension   . Raynaud disease     Patient Active Problem List   Diagnosis Date Noted  . Benzodiazepine misuse 09/13/2018  . Opioid dependence (HCC) 09/13/2018  . Medical non-compliance 09/13/2018  . Acute on chronic respiratory failure with hypoxemia (HCC) 07/25/2018  . Chest pain 02/15/2016  . Chronic low back pain without sciatica 11/20/2014  . Chronic pain of both knees 11/20/2014  . Tobacco abuse 11/20/2014    Past Surgical History:  Procedure  Laterality Date  . APPENDECTOMY    . ESOPHAGOGASTRODUODENOSCOPY (EGD) WITH PROPOFOL N/A 03/17/2017   Procedure: ESOPHAGOGASTRODUODENOSCOPY (EGD) WITH PROPOFOL, removal of foreign body;  Surgeon: Toledo, Boykin Nearing, MD;  Location: [facility] ENDOSCOPY;  Service: Gastroenterology;  Laterality: N/A;    Prior to Admission medications   Medication Sig Start Date End Date Taking? Authorizing Provider  amLODipine (NORVASC) 10 MG tablet Take 10 mg by mouth daily.   Yes [provider]  celecoxib (CELEBREX) 200 MG capsule Take 200 mg by mouth 2 (two) times daily.   Yes [provider]  gabapentin (NEURONTIN) 600 MG tablet Take 600 mg by mouth 2 (two) times daily.    Yes [provider]  hydrochlorothiazide (HYDRODIURIL) 25 MG tablet Take 25 mg by mouth daily.   Yes [provider]  metoprolol tartrate (LOPRESSOR) 25 MG tablet Take 25 mg by mouth 2 (two) times daily.   Yes [provider]  omeprazole (PRILOSEC) 40 MG capsule Take 40 mg by mouth daily.   Yes [provider]  oxyCODONE-acetaminophen (PERCOCET) 10-325 MG tablet Take 1 tablet by mouth 4 (four) times daily as needed. 06/20/19  Yes [provider]  albuterol (PROVENTIL) (2.5 MG/3ML) 0.083% nebulizer solution Take 2.5 mg by nebulization every 6 (six) hours as needed for wheezing or shortness of breath.    [provider]  amitriptyline (ELAVIL) 25 MG tablet Take 25 mg by mouth at bedtime. 06/20/19   [provider]  mometasone-formoterol (  DULERA) 200-5 MCG/ACT AERO Inhale 2 puffs into the lungs 2 (two) times daily. 08/27/18   Shane Crutch, MD    Allergies Ace inhibitors and Vistaril [hydroxyzine hcl]  Family History  Problem Relation Age of Onset  . CAD Mother   . Cirrhosis Father   . CAD Brother     Social History Social History   Tobacco Use  . Smoking status: Former Smoker    Packs/day: 1.00    Years: 45.00    Pack years: 45.00  . Smokeless tobacco:  Never Used  . Tobacco comment: states he quit June 2019  Substance Use Topics  . Alcohol use: Yes    Alcohol/week: 7.0 standard drinks    Types: 7 Cans of beer per week  . Drug use: Yes    Comment: Oxycodone and Percocets for over 10 years per patient    Review of Systems Constitutional: No fever/chills Eyes: No visual changes. ENT: No sore throat.  No neck pain. Cardiovascular: Denies chest pain. Respiratory: Denies shortness of breath. Gastrointestinal: No abdominal pain.   Skin: Negative for rash. Neurological: Negative for headaches, areas of focal weakness or numbness.  Little sore across the back of his scalp.    ____________________________________________   PHYSICAL EXAM:  VITAL SIGNS: ED Triage Vitals  Enc Vitals Group     BP --      Pulse --      Resp --      Temp --      Temp src --      SpO2 --      Weight 06/29/19 1317 145 lb 1 oz (65.8 kg)     Height 06/29/19 1317 5\' 7"  (1.702 m)     Head Circumference --      Peak Flow --      Pain Score 06/29/19 1316 0     Pain Loc --      Pain Edu? --      Excl. in GC? --     Constitutional: Alert and oriented.  He is slightly slurred speech.  Seems slightly somnolent, arouses easily to verbal stimuli but prefers to be resting Eyes: Conjunctivae are normal. Head: Atraumatic.  Pupils are midpoint.  Conjugate gaze. Nose: No congestion/rhinnorhea. Mouth/Throat: Mucous membranes are moist. Neck: No stridor.  Cardiovascular: Minimally tachycardic rate, regular rhythm. Grossly normal heart sounds.  Good peripheral circulation. Respiratory: Normal respiratory effort.  No retractions. Lungs CTAB. Gastrointestinal: Soft and nontender. No distention. Musculoskeletal: No lower extremity tenderness nor edema.  Moves all extremities well.  Follows commands. Neurologic:  Normal speech and language. No gross focal neurologic deficits are appreciated.  Skin:  Skin is warm, dry and intact. No rash noted.  Appears somewhat  pale. Psychiatric: Mood and affect are a little flat, slightly withdrawn.  Denies any desire to harm self or anyone else.  Speech is just slightly slurred and his level of alertness is that of somnolence.  ____________________________________________   LABS (all labs ordered are listed, but only abnormal results are displayed)  Labs Reviewed  CBC - Abnormal; Notable for the following components:      Result Value   WBC 12.1 (*)    RBC 3.71 (*)    Hemoglobin 10.6 (*)    HCT 32.2 (*)    All other components within normal limits  COMPREHENSIVE METABOLIC PANEL - Abnormal; Notable for the following components:   Potassium 2.6 (*)    Chloride 96 (*)    Glucose, Bld 128 (*)  Calcium 8.6 (*)    ALT 51 (*)    All other components within normal limits  ACETAMINOPHEN LEVEL - Abnormal; Notable for the following components:   Acetaminophen (Tylenol), Serum <10 (*)    All other components within normal limits  SARS CORONAVIRUS 2 (TAT 6-24 HRS)  LIPASE, BLOOD  SALICYLATE LEVEL  ETHANOL  CK  AMMONIA  LACTIC ACID, PLASMA  URINE DRUG SCREEN, QUALITATIVE ([facility] ONLY)  MAGNESIUM  TROPONIN I (HIGH SENSITIVITY)   ____________________________________________  EKG  Reviewed inter by me at 1440 Heart rate 100 QRS 99 QTc 470 Atrial fibrillation, no evidence of acute ischemia. ____________________________________________  RADIOLOGY  DG Chest 2 View  Result Date: 06/29/2019 CLINICAL DATA:  Unresponsive episode. EXAM: CHEST - 2 VIEW COMPARISON:  07/26/2018 FINDINGS: Lungs are adequately inflated without consolidation or effusion. Cardiomediastinal silhouette is within normal. Postsurgical changes over the right posterior chest wall and right mid to lower lung. Remainder of the exam is unchanged. IMPRESSION: No acute findings. Electronically Signed   By: Marin Olp M.D.   On: 06/29/2019 14:03   CT Head Wo Contrast  Result Date: 06/29/2019 CLINICAL DATA:  Altered mental status. Patient  found unresponsive without pulse. EXAM: CT HEAD WITHOUT CONTRAST TECHNIQUE: Contiguous axial images were obtained from the base of the skull through the vertex without intravenous contrast. COMPARISON:  07/25/2018 FINDINGS: Brain: Ventricles, cisterns and other CSF spaces are normal. No mass, mass effect, shift of midline structures or acute hemorrhage. No evidence of acute infarction. Subtle loss of gray-white differentiation. Vascular: No hyperdense vessel or unexpected calcification. Skull: Few stable sclerotic foci over the frontal bone and right greater wing of the sphenoid bone. No fracture. Sinuses/Orbits: Orbits are normal. Hypoplastic frontal sinuses. Mucosal membrane thickening over the left maxillary sinus. Other: None. IMPRESSION: 1. No evidence of acute infarction or hemorrhage. Subtle loss of gray differentiation which may reflect patient's recent anoxic event. 2.  Minimal chronic sinus inflammatory change. Electronically Signed   By: Marin Olp M.D.   On: 06/29/2019 14:01    Chest x-ray negative for acute findings. Head CT reviewed, notable for no acute hemorrhage or infarct, but subtle loss of gray differentiation ____________________________________________   PROCEDURES  Procedure(s) performed: None  Procedures  Critical Care performed: Yes, see critical care note(s)  CRITICAL CARE Performed by: Delman Kitten   Total critical care time: 30 minutes  Critical care time was exclusive of separately billable procedures and treating other patients.  Critical care was necessary to treat or prevent imminent or life-threatening deterioration.  Critical care was time spent personally by me on the following activities: development of treatment plan with patient and/or surrogate as well as nursing, discussions with consultants, evaluation of patient's response to treatment, examination of patient, obtaining history from patient or surrogate, ordering and performing treatments and  interventions, ordering and review of laboratory studies, ordering and review of radiographic studies, pulse oximetry and re-evaluation of patient's condition.  Patient presents status post cardiac arrest.  Patient felt to be in arrest secondary to a potential polysubstance or opioid overdose, however this is unclear at time presentation.  Patient with some somnolence altered mental status.  Initial EKG demonstrating new onset A. fib, no noted history of this.  Patient required immediate evaluation for stabilization for concerns of postarrest status  ____________________________________________   INITIAL IMPRESSION / ASSESSMENT AND PLAN / ED COURSE  Pertinent labs & imaging results that were available during my care of the patient were reviewed by me and considered in  my medical decision making (see chart for details).     Clinical Course as of Jun 29 1547  Sat Jun 29, 2019  1343 Patient now alert, conversant. Appears improving.   [MQ]  1431 Discussed for this patient, he denies overdose.  He tells me yesterday he felt sick on his stomach and was vomiting several times yesterday.  But did not quite feel too well today fatigued, and when he was meeting with someone today he suddenly had something happen.  He assumes he must of passed out.   [MQ]    Clinical Course User Index [MQ] Sharyn Creamer, MD   ----------------------------------------- 3:42 PM on 06/29/2019 -----------------------------------------  Patient now reporting to me that he does remember that he has been buying oxycodone for the last couple of days from a friend and he bought a bigger dose than what he takes.  Apparently been buying 30 mg tablets from a friend, and reports may be that is what happened.   ----------------------------------------- 3:50 PM on 06/29/2019 -----------------------------------------  Ongoing care assigned to Dr. Alfred Levins.  Follow-up on remaining evaluation including CT abdomen pelvis, thereafter  would anticipate admission to the hospital for further care and management of this patient, especially in the setting of new onset A. fib, hypokalemia, asystolic cardiac arrest now showing positive signs of good recovery ____________________________________________   FINAL CLINICAL IMPRESSION(S) / ED DIAGNOSES  Final diagnoses:  Cardiac arrest (HCC)  Hypokalemia  Accidental drug overdose, initial encounter  Atrial fibrillation, unspecified type [facility])        Note:  This document was prepared using Dragon voice recognition software and may include unintentional dictation errors       Sharyn Creamer, MD 06/29/19 1550

## 2019-06-29 NOTE — H&P (Signed)
[place] [place] [facility] [place]   PATIENT NAME: Leonard Rogers    MR#:  [id number]  DATE OF BIRTH:  Dec 07, 1955  DATE OF ADMISSION:  06/29/2019  PRIMARY CARE PHYSICIAN: System, Pcp Not In   REQUESTING/REFERRING PHYSICIAN: Sharyn Creamer, MD  CHIEF COMPLAINT:   Chief Complaint  Patient presents with  . Drug Overdose  . Cardiac Arrest    HISTORY OF PRESENT ILLNESS:  Leonard Rogers  is a 64 y.o. male with a known history of COPD, hypertension chronic pain is being admitted for drug overdose leading to cardiac arrest.  He was reportedly found unresponsive in a vehicle.  CPR was administered by bystanders, EMS arrived on scene patient was pulseless and apneic.  He was administered CPR and then 2 mg Narcan by EMS.  He started to arouse shortly thereafter breathing spontaneously.  Had a King airway inserted briefly which was then removed.  Patient reports is not sure what happened.  EMS reports he never had a shockable rhythm.  He went from asystole to possible A. Fib.  Patient has been buying oxycodone for the last couple of days from a friend and he bought a bigger dose than what he takes.  Apparently been buying 30 mg tablets from a friend, and reports may be that is what happened. Does not want hurt himself or anyone else.  Patient reports normally taking 10 mg oxycodone as prescribed but he ran out of it and started taking 30 mg tablet from his friend at least 4 to 5 tablets in last 2 days as he was in a lot of pain and stress.  When I saw him in ED hallways - he is cursing and yelling at me threatening to leave if he doesn't get room immediately. I explained to him the process which he didn't seem to care and said he's left hospitals in past AMA and still alive as I told him that he's just barely survived this time and may not be the case next time if he decides to leave AMA. He's at high risk for another cardiac arrest/arrythmias and could die from it. PAST MEDICAL HISTORY:    Past Medical History:  Diagnosis Date  . Anxiety   . Arthritis   . Chronic pain   . COPD (chronic obstructive pulmonary disease) (HCC)   . Depression   . Difficult intubation   . Hypertension   . Raynaud disease    PAST SURGICAL HISTORY:   Past Surgical History:  Procedure Laterality Date  . APPENDECTOMY    . ESOPHAGOGASTRODUODENOSCOPY (EGD) WITH PROPOFOL N/A 03/17/2017   Procedure: ESOPHAGOGASTRODUODENOSCOPY (EGD) WITH PROPOFOL, removal of foreign body;  Surgeon: Toledo, Boykin Nearing, MD;  Location: [facility] ENDOSCOPY;  Service: Gastroenterology;  Laterality: N/A;   SOCIAL HISTORY:   Social History   Tobacco Use  . Smoking status: Former Smoker    Packs/day: 1.00    Years: 45.00    Pack years: 45.00  . Smokeless tobacco: Never Used  . Tobacco comment: states he quit June 2019  Substance Use Topics  . Alcohol use: Yes    Alcohol/week: 7.0 standard drinks    Types: 7 Cans of beer per week   FAMILY HISTORY:   Family History  Problem Relation Age of Onset  . CAD Mother   . Cirrhosis Father   . CAD Brother    DRUG ALLERGIES:   Allergies  Allergen Reactions  . Ace Inhibitors Anaphylaxis and Other (See Comments)  . Vistaril [Hydroxyzine Hcl]  Unknown per pt   REVIEW OF SYSTEMS:  Review of Systems  Constitutional: Positive for malaise/fatigue. Negative for diaphoresis, fever and weight loss.  HENT: Negative for ear discharge, ear pain, hearing loss, nosebleeds, sore throat and tinnitus.   Eyes: Negative for blurred vision and pain.  Respiratory: Positive for shortness of breath. Negative for cough, hemoptysis and wheezing.   Cardiovascular: Positive for chest pain. Negative for palpitations, orthopnea and leg swelling.  Gastrointestinal: Negative for abdominal pain, blood in stool, constipation, diarrhea, heartburn, nausea and vomiting.  Genitourinary: Negative for dysuria, frequency and urgency.  Musculoskeletal: Negative for back pain and myalgias.  Skin:  Negative for itching and rash.  Neurological: Negative for dizziness, tingling, tremors, focal weakness, seizures, weakness and headaches.  Psychiatric/Behavioral: Negative for depression. The patient is not nervous/anxious.    MEDICATIONS AT HOME:   Prior to Admission medications   Medication Sig Start Date End Date Taking? Authorizing Provider  albuterol (PROVENTIL) (2.5 MG/3ML) 0.083% nebulizer solution Take 2.5 mg by nebulization every 6 (six) hours as needed for wheezing or shortness of breath.   Yes [provider]  amitriptyline (ELAVIL) 25 MG tablet Take 25 mg by mouth at bedtime. 06/20/19  Yes [provider]  amLODipine (NORVASC) 10 MG tablet Take 10 mg by mouth daily.   Yes [provider]  celecoxib (CELEBREX) 200 MG capsule Take 200 mg by mouth 2 (two) times daily.   Yes [provider]  gabapentin (NEURONTIN) 600 MG tablet Take 600 mg by mouth 2 (two) times daily.    Yes [provider]  hydrochlorothiazide (HYDRODIURIL) 25 MG tablet Take 25 mg by mouth daily.   Yes [provider]  metoprolol tartrate (LOPRESSOR) 25 MG tablet Take 25 mg by mouth 2 (two) times daily.   Yes [provider]  omeprazole (PRILOSEC) 40 MG capsule Take 40 mg by mouth daily.   Yes [provider]  oxyCODONE-acetaminophen (PERCOCET) 10-325 MG tablet Take 1 tablet by mouth 4 (four) times daily as needed. 06/20/19  Yes [provider]    VITAL SIGNS:  Blood pressure 105/66, pulse 82, temperature 97.6 F (36.4 C), temperature source Oral, resp. rate 14, height 5\' 7"  (1.702 m), weight 65.8 kg, SpO2 100 %. PHYSICAL EXAMINATION:  Physical Exam  GENERAL:  64 y.o.-year-old patient lying in the bed with no acute distress.  EYES: Pupils equal, round, reactive to light and accommodation. No scleral icterus. Extraocular muscles intact.  HEENT: Head atraumatic, normocephalic. Oropharynx and nasopharynx clear.  NECK:  Supple, no jugular  venous distention. No thyroid enlargement, no tenderness.  LUNGS: Normal breath sounds bilaterally, no wheezing, rales,rhonchi or crepitation. No use of accessory muscles of respiration.  CARDIOVASCULAR: S1, S2 normal. No murmurs, rubs, or gallops.  ABDOMEN: Soft, nontender, nondistended. Bowel sounds present. No organomegaly or mass.  EXTREMITIES: No pedal edema, cyanosis, or clubbing.  NEUROLOGIC: Cranial nerves II through XII are intact. Muscle strength 5/5 in all extremities. Sensation intact. Gait not checked.  PSYCHIATRIC: The patient is alert and oriented x 3.  This.  He is cursing at me threatening to leave. SKIN: No obvious rash, lesion, or ulcer.  LABORATORY PANEL:   CBC Recent Labs  Lab 06/29/19 1328  WBC 12.1*  HGB 10.6*  HCT 32.2*  PLT 263   ------------------------------------------------------------------------------------------------------------------  Chemistries  Recent Labs  Lab 06/29/19 1328  NA 135  K 2.6*  CL 96*  CO2 28  GLUCOSE 128*  BUN 22  CREATININE 0.81  CALCIUM 8.6*  MG 2.3  AST 33  ALT 51*  ALKPHOS 57  BILITOT 0.6   ------------------------------------------------------------------------------------------------------------------  Cardiac Enzymes No results for input(s): TROPONINI in the last 168 hours. ------------------------------------------------------------------------------------------------------------------  RADIOLOGY:  DG Chest 2 View  Result Date: 06/29/2019 CLINICAL DATA:  Unresponsive episode. EXAM: CHEST - 2 VIEW COMPARISON:  07/26/2018 FINDINGS: Lungs are adequately inflated without consolidation or effusion. Cardiomediastinal silhouette is within normal. Postsurgical changes over the right posterior chest wall and right mid to lower lung. Remainder of the exam is unchanged. IMPRESSION: No acute findings. Electronically Signed   By: Elberta Fortis M.D.   On: 06/29/2019 14:03   CT Head Wo Contrast  Result Date:  06/29/2019 CLINICAL DATA:  Altered mental status. Patient found unresponsive without pulse. EXAM: CT HEAD WITHOUT CONTRAST TECHNIQUE: Contiguous axial images were obtained from the base of the skull through the vertex without intravenous contrast. COMPARISON:  07/25/2018 FINDINGS: Brain: Ventricles, cisterns and other CSF spaces are normal. No mass, mass effect, shift of midline structures or acute hemorrhage. No evidence of acute infarction. Subtle loss of gray-white differentiation. Vascular: No hyperdense vessel or unexpected calcification. Skull: Few stable sclerotic foci over the frontal bone and right greater wing of the sphenoid bone. No fracture. Sinuses/Orbits: Orbits are normal. Hypoplastic frontal sinuses. Mucosal membrane thickening over the left maxillary sinus. Other: None. IMPRESSION: 1. No evidence of acute infarction or hemorrhage. Subtle loss of gray differentiation which may reflect patient's recent anoxic event. 2.  Minimal chronic sinus inflammatory change. Electronically Signed   By: Elberta Fortis M.D.   On: 06/29/2019 14:01   CT ABDOMEN PELVIS W CONTRAST  Result Date: 06/29/2019 CLINICAL DATA:  Nausea and vomiting. Found unresponsive. Post CPR. EXAM: CT ABDOMEN AND PELVIS WITH CONTRAST TECHNIQUE: Multidetector CT imaging of the abdomen and pelvis was performed using the standard protocol following bolus administration of intravenous contrast. CONTRAST:  OMNIPAQUE IOHEXOL 300 MG/ML  SOLN COMPARISON:  Chest radiograph same date. Chest CTA 07/24/2018. FINDINGS: Lower chest: Stable pleural thickening and small focus of extrapleural air posteriorly in the right hemithorax (image 2/4). Mild underlying atelectasis or scarring in both lung bases. No significant pleural effusion. Hepatobiliary: Mild periportal edema in the liver. No focally suspicious liver lesion. No evidence of gallstones, gallbladder wall thickening or biliary dilatation. Pancreas: Unremarkable. No pancreatic ductal  dilatation or surrounding inflammatory changes. Spleen: Normal in size without focal abnormality. Adrenals/Urinary Tract: Both adrenal glands appear normal. Evaluation of the kidneys is mildly degraded by breathing artifact. No evidence urinary tract calculus, hydronephrosis or perinephric soft tissue stranding. The bladder appears unremarkable. Stomach/Bowel: Limited gastric distension. No evidence of bowel wall thickening, distention or surrounding inflammatory change. The appendix is not clearly visualized. No pericecal inflammation to suggest appendicitis. Vascular/Lymphatic: There are no enlarged abdominal or pelvic lymph nodes. Aortic and branch vessel atherosclerosis. No evidence of aneurysm or large vessel occlusion. The portal, superior mesenteric and splenic veins appear patent. Reproductive: The prostate gland and seminal vesicles appear normal. Other: No evidence of abdominal wall mass or hernia. No ascites. Musculoskeletal: No acute or significant osseous findings. Previous right thoracotomy. IMPRESSION: 1. No acute findings or explanation for the patient's symptoms. 2. Mild periportal edema in the liver, nonspecific, but likely related to intravenous hydration. 3. Stable postsurgical changes in the right hemithorax with pleural thickening and small focus of extrapleural air. 4. Aortic Atherosclerosis (ICD10-I70.0). Electronically Signed   By: Carey Bullocks M.D.   On: 06/29/2019 16:38   IMPRESSION AND PLAN:  26 y m  admitted per drug OD and cardiac arrest  * Drug OD - will monitor him, check UDS - patient mentioned taking higher dose oxycodone (30 mg instead of 10 mg, 4-6 tabs over in last 2-3 days). - psych c/s  * cardiac arrest CPR was administered by bystanders, EMS arrived on scene patient was pulseless and apneic.  He was administered CPR and then 2 mg Narcan by EMS.  He started to arouse shortly thereafter breathing spontaneously.  Had a King airway inserted briefly which was then  removed. EMS reports he never had a shockable rhythm.  He went from asystole to possible A. Fib. Is in NSR now. Will c/s cardio and obtain echo  * COPD - stable, monitor  * Hypokalemia - replete and recheck, check mg  Nurse texted me later that patient left AMA      All the records are reviewed and case discussed with ED provider. Management plans discussed with the patient, NURSING and they are in agreement.  CODE STATUS: Full code  TOTAL TIME TAKING CARE OF THIS PATIENT: 45 minutes.    Delfino Lovett M.D on 06/29/2019 at 5:31 PM  [facility]   CC: Primary care physician; System, Pcp Not In   Note: This dictation was prepared with Dragon dictation along with smaller phrase technology. Any transcriptional errors that result from this process are unintentional.

## 2019-06-29 NOTE — ED Provider Notes (Signed)
CT imaging was negative.  Patient was admitted to the hospital team   Concha Se, MD 06/29/19 [phone]

## 2019-06-29 NOTE — ED Triage Notes (Signed)
Patient arrived via EMS, emergency traffic. Patient is currently AOx4 but still drowsy. Patient was found unresponsive by civilians with no pulse. CPR was initiated by civilians for roughly 4 minutes. When EMS arrived patient was apneic without a pulse and CPR was continued for another 4 minutes until ROSC was achieved and maintained.   EMS did give 1mg  Intranasal Narcan and 1mg  IV narcan.  Patient has hx of A-Fib and COPD. Patient admits to using percocet and oxycodone for over 10 years.

## 2019-06-30 LAB — SARS CORONAVIRUS 2 (TAT 6-24 HRS): SARS Coronavirus 2: NEGATIVE

## 2023-03-07 ENCOUNTER — Other Ambulatory Visit (HOSPITAL_COMMUNITY): Payer: Self-pay | Admitting: Neurosurgery

## 2023-03-07 DIAGNOSIS — M4722 Other spondylosis with radiculopathy, cervical region: Secondary | ICD-10-CM

## 2023-03-24 ENCOUNTER — Ambulatory Visit (HOSPITAL_COMMUNITY): Payer: 59

## 2023-03-30 ENCOUNTER — Ambulatory Visit
Admission: RE | Admit: 2023-03-30 | Discharge: 2023-03-30 | Disposition: A | Discharge status: Home / Self Care | Payer: 59 | Source: Ambulatory Visit | Attending: Neurosurgery | Admitting: Neurosurgery

## 2023-03-30 DIAGNOSIS — M4722 Other spondylosis with radiculopathy, cervical region: Secondary | ICD-10-CM | POA: Diagnosis present

## 2023-05-22 ENCOUNTER — Ambulatory Visit (HOSPITAL_COMMUNITY)
Admission: RE | Admit: 2023-05-22 | Discharge: 2023-05-22 | Disposition: A | Discharge status: Home / Self Care | Payer: 59 | Source: Ambulatory Visit | Attending: Surgery | Admitting: Surgery

## 2023-05-22 ENCOUNTER — Other Ambulatory Visit (HOSPITAL_COMMUNITY): Payer: Self-pay | Admitting: Neurosurgery

## 2023-05-22 DIAGNOSIS — I739 Peripheral vascular disease, unspecified: Secondary | ICD-10-CM

## 2023-05-22 LAB — VAS US ABI WITH/WO TBI
Left ABI: 1.13
Right ABI: 1.05

## 2023-06-19 ENCOUNTER — Ambulatory Visit (INDEPENDENT_AMBULATORY_CARE_PROVIDER_SITE_OTHER): Admitting: Podiatry

## 2023-06-19 ENCOUNTER — Encounter: Payer: Self-pay | Admitting: Podiatry

## 2023-06-19 ENCOUNTER — Ambulatory Visit (INDEPENDENT_AMBULATORY_CARE_PROVIDER_SITE_OTHER)

## 2023-06-19 DIAGNOSIS — G6289 Other specified polyneuropathies: Secondary | ICD-10-CM

## 2023-06-19 DIAGNOSIS — M778 Other enthesopathies, not elsewhere classified: Secondary | ICD-10-CM | POA: Diagnosis not present

## 2023-06-19 NOTE — Progress Notes (Signed)
 Subjective:  Patient ID: Leonard Rogers, male    DOB: 12/03/1955,  MRN: [id number]  Chief Complaint  Patient presents with   Foot Pain    NP- severe bilateral foot pain, states pain started 3 years with PF feeling pain and now feet feel like hot needles burning and then go numb to where sometimes he cannot feel them for several days    Discussed the use of AI scribe software for clinical note transcription with the patient, who gave verbal consent to proceed.  History of Present Illness Leonard Rogers is a 68 year old male who presents with chronic foot pain and numbness. He was referred by Dr. Yetta Glassman for evaluation of suspected neuropathy.  He has been experiencing burning pain in his feet for over three years, describing it as 'like a needle burning through them' in various spots. Initially localized, the pain has become more diffuse and severe over time, particularly pronounced in the balls of his feet, often feeling as though he has 'stepped on a rock.'  The pain is accompanied by numbness, especially in the balls of his feet, sometimes resulting in a complete lack of sensation upon standing. He recounts an incident a few weeks ago where he nearly fell due to sudden numbness upon waking. The numbness and pain worsen with prolonged standing or walking.  He has been evaluated by multiple specialists, including a surgeon and a nerve doctor. The nerve doctor conducted a brief test, which he felt was inadequate, and ruled out neuropathy. Despite this, he continues to believe his symptoms align with neuropathy, having researched extensively on the condition.  He is currently taking gabapentin 800 mg, which does not alleviate the numbness. He expresses frustration over the lack of a definitive diagnosis and effective treatment after three years of symptoms.  He smokes, but not heavily, having reduced from two to two and a half packs a day after a severe illness four years ago that resulted  in a prolonged hospital stay.  No pain upon pressing on his feet during the examination, although he describes a burning sensation when pressure is applied.      Objective:    Physical Exam VASCULAR: DP and PT pulse palpable. Foot is warm and well-perfused. Capillary fill time is brisk. DERMATOLOGIC: Normal skin turgor, texture, and temperature. No open lesions, rashes, or ulcerations. NEUROLOGIC: Altered sensation with multiple areas lacking protective sensation to monofilament. Delayed response to cessation of vibratory sensation on the left. ORTHOPEDIC: Pes cavus foot type with mild arthritic changes in phalangeal joints. Smooth, pain-free range of motion of all examined joints. No ecchymosis, bruising, or gross deformity. No pain to palpation.   No images are attached to the encounter.    Results RADIOLOGY Foot X-ray: Pes cavus foot type, mild arthritic changes in the phalangeal joints, no major deformity, fracture, or arthritis in the midfoot or hindfoot (06/19/2023)   Assessment:   1. Other polyneuropathy   2. Capsulitis of foot, left   3. Capsulitis of foot, right      Plan:  Patient was evaluated and treated and all questions answered.  Assessment and Plan Assessment & Plan Peripheral Neuropathy Chronic peripheral neuropathy for over three years, characterized by burning, needle-like pain, numbness, and altered sensation in the feet. Symptoms persist despite negative nerve conduction studies and affect balance and daily activities.  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  Gabapentin 800 mg alleviates pain but not numbness. Etiology is unclear; potential causes include past  tibial fracture and idiopathic origins. Diabetes is ruled out. He is concerned about progression and worsening symptoms. Neuropathy is typically incurable, with treatments focusing on symptom management. Gabapentin and Lyrica address pain but not numbness. Qutenza, a  capsaicin patch, is a potential future therapy, currently approved only for diabetic neuropathy. - Continue gabapentin 800 mg for pain management. - Advise multivitamin with B6 and B12 to prevent symptom worsening. - Encourage physical activity and a healthy diet. - Consider physical therapy if balance issues worsen.        Return if symptoms worsen or fail to improve.

## 2023-06-19 NOTE — Patient Instructions (Signed)
 VISIT SUMMARY:  Today, we discussed your chronic foot pain and numbness, which you have been experiencing for over three years. Your symptoms include burning, needle-like pain and numbness, particularly in the balls of your feet, which worsen with prolonged standing or walking. Despite previous evaluations, the exact cause of your symptoms remains unclear, but we are focusing on managing your symptoms effectively.  YOUR PLAN:  -PERIPHERAL NEUROPATHY: Peripheral neuropathy is a condition where the nerves outside of your brain and spinal cord are damaged, causing pain, numbness, and weakness, usually in the hands and feet. We will continue your current medication, gabapentin 800 mg, to help manage the pain. Additionally, I recommend taking a multivitamin with B6 and B12 to help prevent your symptoms from worsening. Regular physical activity and a healthy diet are also important. If your balance issues get worse, we may consider physical therapy. We also discussed the limitations of current treatments and the potential for future therapies like Qutenza, a capsaicin patch, once it is approved for broader use.  INSTRUCTIONS:  Please continue taking gabapentin 800 mg as prescribed. Start taking a multivitamin with B6 and B12 daily. Maintain regular physical activity and a healthy diet. If you notice your balance getting worse, please let us know so we can consider physical therapy. We will keep an eye on new treatments like Qutenza and discuss them as they become available.

## 2023-08-11 ENCOUNTER — Emergency Department
Admission: EM | Admit: 2023-08-11 | Discharge: 2023-08-11 | Disposition: A | Discharge status: Home / Self Care | Attending: Emergency Medicine | Admitting: Emergency Medicine

## 2023-08-11 ENCOUNTER — Other Ambulatory Visit: Payer: Self-pay

## 2023-08-11 DIAGNOSIS — X58XXXA Exposure to other specified factors, initial encounter: Secondary | ICD-10-CM | POA: Diagnosis not present

## 2023-08-11 DIAGNOSIS — Y92096 Garden or yard of other non-institutional residence as the place of occurrence of the external cause: Secondary | ICD-10-CM | POA: Insufficient documentation

## 2023-08-11 DIAGNOSIS — S0501XA Injury of conjunctiva and corneal abrasion without foreign body, right eye, initial encounter: Secondary | ICD-10-CM | POA: Insufficient documentation

## 2023-08-11 DIAGNOSIS — J449 Chronic obstructive pulmonary disease, unspecified: Secondary | ICD-10-CM | POA: Insufficient documentation

## 2023-08-11 DIAGNOSIS — I10 Essential (primary) hypertension: Secondary | ICD-10-CM | POA: Insufficient documentation

## 2023-08-11 DIAGNOSIS — S0591XA Unspecified injury of right eye and orbit, initial encounter: Secondary | ICD-10-CM | POA: Diagnosis present

## 2023-08-11 MED ORDER — KETOROLAC TROMETHAMINE 0.5 % OP SOLN: 1.0000 [drp] | Freq: Four times a day (QID) | OPHTHALMIC | 0 refills | Status: AC

## 2023-08-11 MED ORDER — FLUORESCEIN SODIUM 1 MG OP STRP
1.0000 | ORAL_STRIP | Freq: Once | OPHTHALMIC | Status: AC
  Administered 2023-08-11: 1 via OPHTHALMIC
  Filled 2023-08-11: qty 1

## 2023-08-11 MED ORDER — ERYTHROMYCIN 5 MG/GM OP OINT: 1.0000 | TOPICAL_OINTMENT | Freq: Four times a day (QID) | OPHTHALMIC | 0 refills | Status: AC

## 2023-08-11 MED ORDER — TETRACAINE HCL 0.5 % OP SOLN
2.0000 [drp] | Freq: Once | OPHTHALMIC | Status: AC
  Administered 2023-08-11: 2 [drp] via OPHTHALMIC
  Filled 2023-08-11: qty 4

## 2023-08-11 NOTE — Discharge Instructions (Signed)
 Please follow-up with ophthalmology if not improving over the next 2 to 3 days.  If symptoms change or worsen and you are unable to schedule an appointment, return to the emergency department or see your primary care provider.

## 2023-08-11 NOTE — ED Provider Notes (Signed)
 [facility] Provider Note    Event Date/Time   First MD Initiated Contact with Patient 08/11/23 1629     (approximate)   History   Eye Pain   HPI  Leonard Rogers is a 68 y.o. male with history of hypertension, COPD, arthritis and as listed in EMR presents to the emergency department for evaluation of right eye pain and irritation.  Yesterday he was outside doing some yard work and thinks that he may have gotten something in his eye.  Right eye is red and is draining clear fluid. No change in vision.      Physical Exam   Triage Vital Signs: ED Triage Vitals [08/11/23 1553]  Encounter Vitals Group     BP 139/84     Systolic BP Percentile      Diastolic BP Percentile      Pulse Rate 85     Resp 17     Temp 98 F (36.7 C)     Temp Source Oral     SpO2 95 %     Weight 150 lb (68 kg)     Height 5\' 8"  (1.727 m)     Head Circumference      Peak Flow      Pain Score 10     Pain Loc      Pain Education      Exclude from Growth Chart     Most recent vital signs: Vitals:   08/11/23 1553  BP: 139/84  Pulse: 85  Resp: 17  Temp: 98 F (36.7 C)  SpO2: 95%    General: Awake, no distress.  CV:  Good peripheral perfusion.  Resp:  Normal effort.  Abd:  No distention.  Other:  Right eye erythematous with clear drainage.  Pupils equal, round, reactive to light.  No retained foreign body on magnified exam.  Right eyelid was everted.  Fluorescein stain exam reveals corneal abrasion in the area of the 9 o'clock position.   ED Results / Procedures / Treatments   Labs (all labs ordered are listed, but only abnormal results are displayed) Labs Reviewed - No data to display   EKG  Not indicated   RADIOLOGY  Image and radiology report reviewed and interpreted by me. Radiology report consistent with the same.  Not indicated  PROCEDURES:  Critical Care performed: No  Procedures   MEDICATIONS ORDERED IN ED:  Medications   fluorescein ophthalmic strip 1 strip (has no administration in time range)  tetracaine (PONTOCAINE) 0.5 % ophthalmic solution 2 drop (has no administration in time range)     IMPRESSION / MDM / ASSESSMENT AND PLAN / ED COURSE   I have reviewed the triage note.  Differential diagnosis includes, but is not limited to, retained foreign body, corneal abrasion  Patient's presentation is most consistent with acute illness / injury with system symptoms.  68 year old man presenting to the emergency department for treatment and evaluation of right pain and irritation.  He was outside weed eating yesterday without wearing safety glasses and believes he must of gotten something in his eye.  Irritation awakened him around 5 AM and has continued throughout the day.  He has attempted to rinse the eye without any improvement.  On fluorescein stain exam, he does have dye uptake in the area of the 9 o'clock position.  There is no obvious retained foreign body on magnified exam.  Plan will be to treat him with Acular and erythromycin ointment.  Follow-up information for  ophthalmology was provided.  He was encouraged to be evaluated if not improving over the next 2 to 3 days.  Symptoms change or worsen and he is unable to see his primary care provider or ophthalmology, he was encouraged to return to the emergency department.      FINAL CLINICAL IMPRESSION(S) / ED DIAGNOSES   Final diagnoses:  Abrasion of right cornea, initial encounter     Rx / DC Orders   ED Discharge Orders          Ordered    ketorolac (ACULAR) 0.5 % ophthalmic solution  4 times daily        08/11/23 1641    erythromycin ophthalmic ointment  4 times daily        08/11/23 1641             Note:  This document was prepared using Dragon voice recognition software and may include unintentional dictation errors.   Sherryle Don, FNP 08/11/23 1652    Lubertha Rush, MD 08/11/23 [phone]

## 2023-08-11 NOTE — ED Triage Notes (Signed)
 Pt sts that he was doing yard work yesterday and thinks that he got something in his eye. Pt right eye is red and having clear drainage.

## 2023-09-14 ENCOUNTER — Telehealth: Payer: Self-pay

## 2023-09-14 ENCOUNTER — Other Ambulatory Visit: Payer: Self-pay

## 2023-09-14 DIAGNOSIS — Z1211 Encounter for screening for malignant neoplasm of colon: Secondary | ICD-10-CM

## 2023-09-14 DIAGNOSIS — R195 Other fecal abnormalities: Secondary | ICD-10-CM

## 2023-09-14 MED ORDER — NA SULFATE-K SULFATE-MG SULF 17.5-3.13-1.6 GM/177ML PO SOLN: 1.0000 | Freq: Once | ORAL | 0 refills | Status: AC

## 2023-09-14 NOTE — Telephone Encounter (Signed)
 Gastroenterology Pre-Procedure Review  Request Date: 10/31/23 Requesting Physician: Dr. jinny  PATIENT REVIEW QUESTIONS: The patient responded to the following health history questions as indicated:    1. Are you having any GI issues? yes (positive fit, 1st colonoscopy) 2. Do you have a personal history of Polyps? no 3. Do you have a family history of Colon Cancer or Polyps? no 4. Diabetes Mellitus? no 5. Joint replacements in the past 12 months?no 6. Major health problems in the past 3 months?no 7. Any artificial heart valves, MVP, or defibrillator?no    MEDICATIONS & ALLERGIES:    Patient reports the following regarding taking any anticoagulation/antiplatelet therapy:   Plavix, Coumadin, Eliquis, Xarelto, Lovenox , Pradaxa, Brilinta, or Effient? no Aspirin ? no  Patient confirms/reports the following medications:  Current Outpatient Medications  Medication Sig Dispense Refill   albuterol  (PROVENTIL ) (2.5 MG/3ML) 0.083% nebulizer solution Take 2.5 mg by nebulization every 6 (six) hours as needed for wheezing or shortness of breath.     amitriptyline  (ELAVIL ) 25 MG tablet Take 25 mg by mouth at bedtime.     amLODipine  (NORVASC ) 10 MG tablet Take 10 mg by mouth daily.     celecoxib  (CELEBREX ) 200 MG capsule Take 200 mg by mouth 2 (two) times daily.     gabapentin (NEURONTIN) 600 MG tablet Take 600 mg by mouth 2 (two) times daily.      hydrochlorothiazide  (HYDRODIURIL ) 25 MG tablet Take 25 mg by mouth daily.     ketorolac  (ACULAR ) 0.5 % ophthalmic solution Place 1 drop into the left eye 4 (four) times daily. 5 mL 0   metoprolol  tartrate (LOPRESSOR ) 25 MG tablet Take 25 mg by mouth 2 (two) times daily.     omeprazole (PRILOSEC) 40 MG capsule Take 40 mg by mouth daily.     oxyCODONE -acetaminophen  (PERCOCET) 10-325 MG tablet Take 1 tablet by mouth 4 (four) times daily as needed.     No current facility-administered medications for this visit.    Patient confirms/reports the following  allergies:  Allergies  Allergen Reactions   Ace Inhibitors Anaphylaxis and Other (See Comments)   Vistaril [Hydroxyzine Hcl]     Unknown per pt    No orders of the defined types were placed in this encounter.   AUTHORIZATION INFORMATION Primary Insurance: 1D#: Group #:  Secondary Insurance: 1D#: Group #:  SCHEDULE INFORMATION: Date: 10/31/23 Time: Location: [facility]

## 2023-10-24 ENCOUNTER — Encounter: Payer: Self-pay | Admitting: Gastroenterology

## 2023-10-31 ENCOUNTER — Encounter: Payer: Self-pay | Admitting: Gastroenterology

## 2023-10-31 ENCOUNTER — Other Ambulatory Visit: Payer: Self-pay

## 2023-10-31 ENCOUNTER — Encounter
Admission: RE | Disposition: A | Discharge status: Home / Self Care | Payer: Self-pay | Source: Home / Self Care | Attending: Gastroenterology

## 2023-10-31 ENCOUNTER — Ambulatory Visit
Admission: RE | Admit: 2023-10-31 | Discharge: 2023-10-31 | Disposition: A | Discharge status: Home / Self Care | Attending: Gastroenterology | Admitting: Gastroenterology

## 2023-10-31 ENCOUNTER — Ambulatory Visit: Admitting: Certified Registered Nurse Anesthetist

## 2023-10-31 DIAGNOSIS — Z1211 Encounter for screening for malignant neoplasm of colon: Secondary | ICD-10-CM | POA: Diagnosis present

## 2023-10-31 DIAGNOSIS — K621 Rectal polyp: Secondary | ICD-10-CM | POA: Diagnosis not present

## 2023-10-31 DIAGNOSIS — I10 Essential (primary) hypertension: Secondary | ICD-10-CM | POA: Insufficient documentation

## 2023-10-31 DIAGNOSIS — K640 First degree hemorrhoids: Secondary | ICD-10-CM | POA: Insufficient documentation

## 2023-10-31 DIAGNOSIS — K635 Polyp of colon: Secondary | ICD-10-CM

## 2023-10-31 DIAGNOSIS — J449 Chronic obstructive pulmonary disease, unspecified: Secondary | ICD-10-CM | POA: Insufficient documentation

## 2023-10-31 DIAGNOSIS — I7300 Raynaud's syndrome without gangrene: Secondary | ICD-10-CM | POA: Insufficient documentation

## 2023-10-31 DIAGNOSIS — G8929 Other chronic pain: Secondary | ICD-10-CM | POA: Diagnosis not present

## 2023-10-31 DIAGNOSIS — F17210 Nicotine dependence, cigarettes, uncomplicated: Secondary | ICD-10-CM | POA: Insufficient documentation

## 2023-10-31 DIAGNOSIS — K219 Gastro-esophageal reflux disease without esophagitis: Secondary | ICD-10-CM | POA: Diagnosis not present

## 2023-10-31 DIAGNOSIS — R195 Other fecal abnormalities: Secondary | ICD-10-CM

## 2023-10-31 HISTORY — PX: POLYPECTOMY: SHX149

## 2023-10-31 HISTORY — PX: COLONOSCOPY: SHX5424

## 2023-10-31 SURGERY — COLONOSCOPY
Anesthesia: General

## 2023-10-31 MED ORDER — SODIUM CHLORIDE 0.9 % IV SOLN: INTRAVENOUS | Status: DC

## 2023-10-31 MED ORDER — PROPOFOL 500 MG/50ML IV EMUL
INTRAVENOUS | Status: DC | PRN
  Administered 2023-10-31 (×2): 150 ug/kg/min via INTRAVENOUS

## 2023-10-31 MED ORDER — LIDOCAINE HCL (PF) 2 % IJ SOLN
INTRAMUSCULAR | Status: AC
Start: 2023-10-31 — End: 2023-10-31
  Filled 2023-10-31: qty 5

## 2023-10-31 MED ORDER — DEXMEDETOMIDINE HCL IN NACL 80 MCG/20ML IV SOLN
INTRAVENOUS | Status: DC | PRN
  Administered 2023-10-31: 4 ug via INTRAVENOUS
  Administered 2023-10-31 (×2): 8 ug via INTRAVENOUS
  Administered 2023-10-31: 4 ug via INTRAVENOUS

## 2023-10-31 MED ORDER — PROPOFOL 1000 MG/100ML IV EMUL
INTRAVENOUS | Status: AC
  Filled 2023-10-31: qty 100

## 2023-10-31 MED ORDER — PROPOFOL 10 MG/ML IV BOLUS
INTRAVENOUS | Status: DC | PRN
Start: 2023-10-31 — End: 2023-10-31
  Administered 2023-10-31: 20 mg via INTRAVENOUS
  Administered 2023-10-31: 60 mg via INTRAVENOUS
  Administered 2023-10-31: 20 mg via INTRAVENOUS
  Administered 2023-10-31: 60 mg via INTRAVENOUS
  Administered 2023-10-31 (×2): 20 mg via INTRAVENOUS

## 2023-10-31 NOTE — Anesthesia Procedure Notes (Signed)
 Date/Time: 10/31/2023 8:19 AM  Performed by: Duwayne Craven, CRNAPre-anesthesia Checklist: Patient identified, Emergency Drugs available, Suction available, Patient being monitored and Timeout performed Patient Re-evaluated:Patient Re-evaluated prior to induction Oxygen Delivery Method: Nasal cannula Induction Type: IV induction Placement Confirmation: CO2 detector and positive ETCO2

## 2023-10-31 NOTE — Op Note (Signed)
 [facility] Gastroenterology Patient Name: Leonard Rogers Procedure Date: 10/31/2023 8:09 AM MRN: [id number] Account #: [id number] Date of Birth: [birth year] Admit Type: Outpatient Age: 68 Room: [facility] ENDO ROOM 4 Gender: Male Note Status: Finalized Instrument Name: Colon Scope [id number] Procedure:             Colonoscopy Indications:           Screening for colorectal malignant neoplasm Providers:             [name] MD, MD Referring MD:          [name] MD, MD (Referring MD) Medicines:             Propofol  per Anesthesia Complications:         No immediate complications. Procedure:             Pre-Anesthesia Assessment:                        - Prior to the procedure, a History and Physical was                         performed, and patient medications and allergies were                         reviewed. The patient's tolerance of previous                         anesthesia was also reviewed. The risks and benefits                         of the procedure and the sedation options and risks                         were discussed with the patient. All questions were                         answered, and informed consent was obtained. Prior                         Anticoagulants: The patient has taken no anticoagulant                         or antiplatelet agents. ASA Grade Assessment: II - A                         patient with mild systemic disease. After reviewing                         the risks and benefits, the patient was deemed in                         satisfactory condition to undergo the procedure.                        After obtaining informed consent, the colonoscope was                         passed under direct vision. Throughout the procedure,  the patient's blood pressure, pulse, and oxygen                         saturations were monitored continuously. The                         Colonoscope was introduced  through the anus and                         advanced to the the cecum, identified by appendiceal                         orifice and ileocecal valve. The colonoscopy was                         performed without difficulty. The patient tolerated                         the procedure well. The quality of the bowel                         preparation was excellent. Findings:      The perianal and digital rectal examinations were normal.      Four sessile polyps were found in the rectum. The polyps were 3 to 5 mm       in size. These polyps were removed with a cold snare. Resection and       retrieval were complete.      Non-bleeding internal hemorrhoids were found during retroflexion. The       hemorrhoids were Grade I (internal hemorrhoids that do not prolapse). Impression:            - Four 3 to 5 mm polyps in the rectum, removed with a                         cold snare. Resected and retrieved.                        - Non-bleeding internal hemorrhoids. Recommendation:        - Discharge patient to home.                        - Resume previous diet.                        - Continue present medications.                        - Await pathology results.                        - If the pathology report reveals adenomatous tissue,                         then repeat the colonoscopy for surveillance in 5                         years otherwise 10 years. Procedure Code(s):     --- Professional ---  54614, Colonoscopy, flexible; with removal of                         tumor(s), polyp(s), or other lesion(s) by snare                         technique Diagnosis Code(s):     --- Professional ---                        Z12.11, Encounter for screening for malignant neoplasm                         of colon                        D12.8, Benign neoplasm of rectum CPT copyright 2022 [employer]. All rights reserved. The codes documented in this report are  preliminary and upon coder review may  be revised to meet current compliance requirements. [name] MD, MD 10/31/2023 8:39:19 AM This report has been signed electronically. Number of Addenda: 0 Note Initiated On: 10/31/2023 8:09 AM Scope Withdrawal Time: 0 hours 10 minutes 9 seconds  Total Procedure Duration: 0 hours 12 minutes 23 seconds  Estimated Blood Loss:  Estimated blood loss: none.      [facility]

## 2023-10-31 NOTE — Transfer of Care (Signed)
 Immediate Anesthesia Transfer of Care Note  Patient: Leonard Rogers  Procedure(s) Performed: COLONOSCOPY POLYPECTOMY, INTESTINE  Patient Location: PACU  Anesthesia Type:General  Level of Consciousness: awake and alert   Airway & Oxygen Therapy: Patient Spontanous Breathing  Post-op Assessment: Report given to RN and Post -op Vital signs reviewed and stable  Post vital signs: Reviewed and stable  Last Vitals:  Vitals Value Taken Time  BP    Temp    Pulse    Resp    SpO2      Last Pain:  Vitals:   10/31/23 0806  TempSrc: Temporal  PainSc: 0-No pain         Complications: No notable events documented.

## 2023-10-31 NOTE — Anesthesia Postprocedure Evaluation (Signed)
 Anesthesia Post Note  Patient: Leonard Rogers  Procedure(s) Performed: COLONOSCOPY POLYPECTOMY, INTESTINE  Patient location during evaluation: PACU Anesthesia Type: General Level of consciousness: awake and alert, oriented and patient cooperative Pain management: pain level controlled Vital Signs Assessment: post-procedure vital signs reviewed and stable Respiratory status: spontaneous breathing, nonlabored ventilation and respiratory function stable Cardiovascular status: blood pressure returned to baseline and stable Postop Assessment: adequate PO intake Anesthetic complications: no   No notable events documented.   Last Vitals:  Vitals:   10/31/23 0844 10/31/23 0851  BP: 136/75 104/71  Pulse: 83   Resp: 18 16  Temp:    SpO2: 95% 95%    Last Pain:  Vitals:   10/31/23 0851  TempSrc:   PainSc: 0-No pain                 Alfonso Ruths

## 2023-10-31 NOTE — Anesthesia Preprocedure Evaluation (Addendum)
 Anesthesia Evaluation  Patient identified by MRN, date of birth, ID band Patient awake    Reviewed: Allergy & Precautions, NPO status , Patient's Chart, lab work & pertinent test results  History of Anesthesia Complications (+) DIFFICULT AIRWAY and history of anesthetic complications  Airway Mallampati: II   Neck ROM: Full    Dental  (+) Edentulous Upper, Edentulous Lower   Pulmonary COPD, Current Smoker (3 cigarettes per day)Patient did not abstain from smoking.   Pulmonary exam normal breath sounds clear to auscultation       Cardiovascular hypertension, Normal cardiovascular exam Rhythm:Regular Rate:Normal     Neuro/Psych  PSYCHIATRIC DISORDERS Anxiety Depression    Chronic pain  Neuromuscular disease (polyneuropathy)    GI/Hepatic ,GERD  ,,  Endo/Other  negative endocrine ROS    Renal/GU negative Renal ROS     Musculoskeletal  (+) Arthritis ,  Raynaud   Abdominal   Peds  Hematology negative hematology ROS (+)   Anesthesia Other Findings   Reproductive/Obstetrics                              Anesthesia Physical Anesthesia Plan  ASA: 3  Anesthesia Plan: General   Post-op Pain Management:    Induction: Intravenous  PONV Risk Score and Plan: 1 and Propofol  infusion, TIVA and Treatment may vary due to age or medical condition  Airway Management Planned: Natural Airway  Additional Equipment:   Intra-op Plan:   Post-operative Plan:   Informed Consent: I have reviewed the patients History and Physical, chart, labs and discussed the procedure including the risks, benefits and alternatives for the proposed anesthesia with the patient or authorized representative who has indicated his/her understanding and acceptance.       Plan Discussed with: CRNA  Anesthesia Plan Comments: (LMA/GETA backup discussed.  Patient consented for risks of anesthesia including but not limited to:   - adverse reactions to medications - damage to eyes, teeth, lips or other oral mucosa - nerve damage due to positioning  - sore throat or hoarseness - damage to heart, brain, nerves, lungs, other parts of body or loss of life  Informed patient about role of CRNA in peri- and intra-operative care.  Patient voiced understanding.)         Anesthesia Quick Evaluation

## 2023-10-31 NOTE — H&P (Signed)
 Leonard Copping, MD [facility] [address], [address] [place], [place] [address] Phone: [phone] Fax : [phone]  Primary Care Physician:  [facility] Primary Gastroenterologist:  Dr. Copping  Pre-Procedure History & Physical: HPI:  Leonard Rogers is a 68 y.o. male is here for a screening colonoscopy.   Past Medical History:  Diagnosis Date   Anxiety    Arthritis    Chronic pain    COPD (chronic obstructive pulmonary disease) (HCC)    Depression    Difficult intubation    Hypertension    Raynaud disease     Past Surgical History:  Procedure Laterality Date   APPENDECTOMY     ESOPHAGOGASTRODUODENOSCOPY (EGD) WITH PROPOFOL  N/A 03/17/2017   Procedure: ESOPHAGOGASTRODUODENOSCOPY (EGD) WITH PROPOFOL , removal of foreign body;  Surgeon: Toledo, Ladell POUR, MD;  Location: [facility] ENDOSCOPY;  Service: Gastroenterology;  Laterality: N/A;    Prior to Admission medications   Medication Sig Start Date End Date Taking? Authorizing Provider  amitriptyline  (ELAVIL ) 25 MG tablet Take 25 mg by mouth at bedtime. 06/20/19  Yes [provider]  amLODipine  (NORVASC ) 10 MG tablet Take 10 mg by mouth daily.   Yes [provider]  celecoxib  (CELEBREX ) 200 MG capsule Take 200 mg by mouth 2 (two) times daily.   Yes [provider]  gabapentin (NEURONTIN) 600 MG tablet Take 600 mg by mouth 2 (two) times daily.    Yes [provider]  hydrochlorothiazide  (HYDRODIURIL ) 25 MG tablet Take 25 mg by mouth daily.   Yes [provider]  ketorolac  (ACULAR ) 0.5 % ophthalmic solution Place 1 drop into the left eye 4 (four) times daily. 08/11/23  Yes Triplett, Cari B, FNP  metoprolol  tartrate (LOPRESSOR ) 25 MG tablet Take 25 mg by mouth 2 (two) times daily.   Yes [provider]  omeprazole (PRILOSEC) 40 MG capsule Take 40 mg by mouth daily.   Yes [provider]  oxyCODONE -acetaminophen  (PERCOCET) 10-325 MG tablet Take 1 tablet by mouth 4  (four) times daily as needed. 06/20/19  Yes [provider]  albuterol  (PROVENTIL ) (2.5 MG/3ML) 0.083% nebulizer solution Take 2.5 mg by nebulization every 6 (six) hours as needed for wheezing or shortness of breath.    [provider]    Allergies as of 09/14/2023 - Review Complete 09/14/2023  Allergen Reaction Noted   Ace inhibitors Anaphylaxis and Other (See Comments) 11/20/2014   Vistaril [hydroxyzine hcl]  02/15/2016    Family History  Problem Relation Age of Onset   CAD Mother    Cirrhosis Father    CAD Brother     Social History   Socioeconomic History   Marital status: Married    Spouse name: Not on file   Number of children: Not on file   Years of education: Not on file   Highest education level: Not on file  Occupational History   Not on file  Tobacco Use   Smoking status: Every Day    Current packs/day: 1.00    Average packs/day: 1 pack/day for 45.0 years (45.0 ttl pk-yrs)    Types: Cigarettes   Smokeless tobacco: Never   Tobacco comments:    states he quit June 2019  Vaping Use   Vaping status: Never Used  Substance and Sexual Activity   Alcohol use: Not Currently    Alcohol/week: 7.0 standard drinks of alcohol    Types: 7 Cans of beer per week   Drug use: Yes    Types: Oxycodone   Comment: Oxycodone  and Percocets for over 10 years per patient   Sexual activity: Yes  Other Topics Concern   Not on file  Social History Narrative   Not on file   Social Drivers of Health   Financial Resource Strain: Not on file  Food Insecurity: Not on file  Transportation Needs: Not on file  Physical Activity: Not on file  Stress: Not on file  Social Connections: Not on file  Intimate Partner Violence: Not on file    Review of Systems: See HPI, otherwise negative ROS  Physical Exam: There were no vitals taken for this visit. General:   Alert,  pleasant and cooperative in NAD Head:  Normocephalic and atraumatic. Neck:  Supple; no masses or  thyromegaly. Lungs:  Clear throughout to auscultation.    Heart:  Regular rate and rhythm. Abdomen:  Soft, nontender and nondistended. Normal bowel sounds, without guarding, and without rebound.   Neurologic:  Alert and  oriented x4;  grossly normal neurologically.  Impression/Plan: Leonard Rogers is now here to undergo a screening colonoscopy.  Risks, benefits, and alternatives regarding colonoscopy have been reviewed with the patient.  Questions have been answered.  All parties agreeable.

## 2023-11-01 ENCOUNTER — Encounter: Payer: Self-pay | Admitting: Gastroenterology

## 2023-11-01 LAB — SURGICAL PATHOLOGY

## 2023-11-02 ENCOUNTER — Ambulatory Visit: Payer: Self-pay | Admitting: Gastroenterology

## 2023-11-10 NOTE — Progress Notes (Signed)
 This encounter was created in error - please disregard.
